# Patient Record
Sex: Female | Born: 1980 | Race: Black or African American | Hispanic: No | Marital: Single | Smoking: Former smoker
Health system: Southern US, Community
[De-identification: ages and names within clinical notes are randomized; demographics above are authoritative.]

## PROBLEM LIST (undated history)

## (undated) ENCOUNTER — Inpatient Hospital Stay (HOSPITAL_COMMUNITY): Payer: Self-pay

## (undated) DIAGNOSIS — Q513 Bicornate uterus: Secondary | ICD-10-CM

## (undated) DIAGNOSIS — K219 Gastro-esophageal reflux disease without esophagitis: Secondary | ICD-10-CM

## (undated) DIAGNOSIS — E785 Hyperlipidemia, unspecified: Secondary | ICD-10-CM

## (undated) DIAGNOSIS — N898 Other specified noninflammatory disorders of vagina: Principal | ICD-10-CM

## (undated) DIAGNOSIS — D649 Anemia, unspecified: Secondary | ICD-10-CM

## (undated) DIAGNOSIS — B379 Candidiasis, unspecified: Secondary | ICD-10-CM

## (undated) DIAGNOSIS — O26891 Other specified pregnancy related conditions, first trimester: Principal | ICD-10-CM

## (undated) DIAGNOSIS — E669 Obesity, unspecified: Secondary | ICD-10-CM

## (undated) DIAGNOSIS — Q5128 Other doubling of uterus, other specified: Secondary | ICD-10-CM

## (undated) DIAGNOSIS — O26899 Other specified pregnancy related conditions, unspecified trimester: Secondary | ICD-10-CM

## (undated) DIAGNOSIS — F419 Anxiety disorder, unspecified: Secondary | ICD-10-CM

## (undated) DIAGNOSIS — Q512 Other doubling of uterus, unspecified: Secondary | ICD-10-CM

## (undated) DIAGNOSIS — R12 Heartburn: Secondary | ICD-10-CM

## (undated) HISTORY — DX: Other and unspecified doubling of uterus: Q51.28

## (undated) HISTORY — DX: Other doubling of uterus, unspecified: Q51.20

## (undated) HISTORY — DX: Other specified noninflammatory disorders of vagina: N89.8

## (undated) HISTORY — DX: Candidiasis, unspecified: B37.9

## (undated) HISTORY — PX: WISDOM TOOTH EXTRACTION: SHX21

## (undated) HISTORY — DX: Bicornate uterus: Q51.3

## (undated) HISTORY — DX: Anemia, unspecified: D64.9

## (undated) HISTORY — PX: OTHER SURGICAL HISTORY: SHX169

## (undated) HISTORY — DX: Other specified pregnancy related conditions, first trimester: O26.891

## (undated) HISTORY — DX: Hyperlipidemia, unspecified: E78.5

## (undated) HISTORY — DX: Obesity, unspecified: E66.9

---
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
## 2001-01-13 ENCOUNTER — Ambulatory Visit (HOSPITAL_COMMUNITY): Admission: RE | Admit: 2001-01-13 | Discharge: 2001-01-13 | Payer: Self-pay | Admitting: Internal Medicine

## 2001-01-13 ENCOUNTER — Encounter: Payer: Self-pay | Admitting: Internal Medicine

## 2003-04-25 ENCOUNTER — Emergency Department (HOSPITAL_COMMUNITY): Admission: EM | Admit: 2003-04-25 | Discharge: 2003-04-25 | Payer: Self-pay | Admitting: Emergency Medicine

## 2003-04-25 ENCOUNTER — Encounter: Payer: Self-pay | Admitting: Emergency Medicine

## 2007-05-03 ENCOUNTER — Emergency Department (HOSPITAL_COMMUNITY): Admission: EM | Admit: 2007-05-03 | Discharge: 2007-05-03 | Payer: Self-pay | Admitting: Emergency Medicine

## 2008-07-23 ENCOUNTER — Inpatient Hospital Stay (HOSPITAL_COMMUNITY): Admission: AD | Admit: 2008-07-23 | Discharge: 2008-07-23 | Payer: Self-pay | Admitting: Obstetrics & Gynecology

## 2008-07-30 ENCOUNTER — Ambulatory Visit (HOSPITAL_COMMUNITY): Admission: RE | Admit: 2008-07-30 | Discharge: 2008-07-30 | Payer: Self-pay | Admitting: Obstetrics and Gynecology

## 2009-07-25 ENCOUNTER — Inpatient Hospital Stay (HOSPITAL_COMMUNITY): Admission: RE | Admit: 2009-07-25 | Discharge: 2009-07-27 | Payer: Self-pay | Admitting: Obstetrics and Gynecology

## 2009-11-15 ENCOUNTER — Other Ambulatory Visit: Admission: RE | Admit: 2009-11-15 | Discharge: 2009-11-15 | Payer: Self-pay | Admitting: Obstetrics and Gynecology

## 2010-10-27 LAB — RH IMMUNE GLOB WKUP(>/=20WKS)(NOT WOMEN'S HOSP): Fetal Screen: NEGATIVE

## 2010-10-27 LAB — CBC
HCT: 34.8 % — ABNORMAL LOW (ref 36.0–46.0)
Hemoglobin: 11.7 g/dL — ABNORMAL LOW (ref 12.0–15.0)
MCHC: 33.2 g/dL (ref 30.0–36.0)
MCHC: 33.5 g/dL (ref 30.0–36.0)
MCV: 82.6 fL (ref 78.0–100.0)
MCV: 83.5 fL (ref 78.0–100.0)
Platelets: 200 10*3/uL (ref 150–400)
Platelets: 240 10*3/uL (ref 150–400)
RBC: 4.21 MIL/uL (ref 3.87–5.11)
RDW: 14.6 % (ref 11.5–15.5)
RDW: 14.6 % (ref 11.5–15.5)
WBC: 11.1 10*3/uL — ABNORMAL HIGH (ref 4.0–10.5)

## 2010-10-27 LAB — RPR: RPR Ser Ql: NONREACTIVE

## 2010-11-10 LAB — ABO/RH: ABO/RH(D): A NEG

## 2010-11-10 LAB — CBC
MCV: 81.6 fL (ref 78.0–100.0)
Platelets: 291 10*3/uL (ref 150–400)
WBC: 8.6 10*3/uL (ref 4.0–10.5)

## 2010-11-10 LAB — HCG, QUANTITATIVE, PREGNANCY: hCG, Beta Chain, Quant, S: 17 m[IU]/mL — ABNORMAL HIGH (ref <5)

## 2011-05-01 LAB — URINALYSIS, ROUTINE W REFLEX MICROSCOPIC
Bilirubin Urine: NEGATIVE
Nitrite: NEGATIVE
Specific Gravity, Urine: 1.025 (ref 1.005–1.030)
Urobilinogen, UA: 0.2 mg/dL (ref 0.0–1.0)

## 2011-05-01 LAB — RH IMMUNE GLOBULIN WORKUP (NOT WOMEN'S HOSP): Antibody Screen: NEGATIVE

## 2011-05-01 LAB — CBC
MCHC: 33.3 g/dL (ref 30.0–36.0)
RBC: 4.39 MIL/uL (ref 3.87–5.11)
WBC: 6.3 10*3/uL (ref 4.0–10.5)

## 2011-05-01 LAB — URINE MICROSCOPIC-ADD ON

## 2011-05-01 LAB — HCG, QUANTITATIVE, PREGNANCY: hCG, Beta Chain, Quant, S: 1710 m[IU]/mL — ABNORMAL HIGH (ref <5)

## 2011-05-01 LAB — ABO/RH: ABO/RH(D): A NEG

## 2011-10-20 ENCOUNTER — Encounter: Payer: Self-pay | Admitting: Family Medicine

## 2011-10-20 ENCOUNTER — Ambulatory Visit (INDEPENDENT_AMBULATORY_CARE_PROVIDER_SITE_OTHER): Payer: BC Managed Care – PPO | Admitting: Family Medicine

## 2011-10-20 VITALS — BP 130/74 | HR 99 | Resp 18 | Ht 64.25 in | Wt 274.1 lb

## 2011-10-20 DIAGNOSIS — F17200 Nicotine dependence, unspecified, uncomplicated: Secondary | ICD-10-CM

## 2011-10-20 DIAGNOSIS — D649 Anemia, unspecified: Secondary | ICD-10-CM

## 2011-10-20 DIAGNOSIS — Z720 Tobacco use: Secondary | ICD-10-CM

## 2011-10-20 DIAGNOSIS — E785 Hyperlipidemia, unspecified: Secondary | ICD-10-CM

## 2011-10-20 DIAGNOSIS — E669 Obesity, unspecified: Secondary | ICD-10-CM

## 2011-10-20 NOTE — Assessment & Plan Note (Signed)
Check CBC 

## 2011-10-20 NOTE — Assessment & Plan Note (Signed)
·   Obtain FLP

## 2011-10-20 NOTE — Assessment & Plan Note (Addendum)
Discussed importance of weight loss and exercise. Patient heaviest weight 290 pounds. She was on phentermine in the past. I think about any other comorbidities and her history of to well on this medication before this can be restarted if her labs come back normal.

## 2011-10-20 NOTE — Patient Instructions (Signed)
Get your blood work done fasting- do not eat midnight If your labs look good we will start phentermine at 1/2 tablet and then increase to 1 full tablet Schedule a physical for your PAP Smear and immunizations

## 2011-10-20 NOTE — Progress Notes (Signed)
  Subjective:    Patient ID: Amanda Sanchez, female    DOB: 05/12/1981, 31 y.o.   MRN: [id number]  HPI Patient here to establish care. Her previous primary care provider. She's not been seen in greater than 2 years. Medications and history reviewed Her last Pap smear was 2 years ago by [facility]. She also had Implanon placed at that time. She does not have a menstrual cycle  Obesity-  Patients heavist weight was 290 pounds. She was seen by the weight loss clinic in [place] however cannot afford to get to a couple visits. She was also seen bya  clinic in [place] a few years ago and was on phentermine where she lost 30 pounds in the past. At that time she was exercising 5 days a week. She will like to restart this medication.  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 [place] She is married with a 9 year old son  Review of Systems   GEN- denies fatigue, fever, weight loss,weakness, recent illness HEENT- denies eye drainage, change in vision, nasal discharge, CVS- denies chest pain, palpitations RESP- denies SOB, cough, wheeze ABD- denies N/V, change in stools, abd pain GU- denies dysuria, hematuria, dribbling, incontinence MSK- denies joint pain, muscle aches, injury Neuro- denies headache, dizziness, syncope, seizure activity      Objective:   Physical Exam  GEN- NAD, alert and oriented x3, obese HEENT- PERRL, EOMI, non injected sclera, pink conjunctiva, MMM, oropharynx clear Neck- Supple, no thyromegaly CVS- RRR, no murmur RESP-CTAB ABD- NABS,soft, NT, ND EXT- No edema Pulses- Radial, DP- 2+ Psych-normal mentation, not depressed or anxious appearing      Assessment & Plan:

## 2011-10-20 NOTE — Assessment & Plan Note (Signed)
Discussed importance of cessation  

## 2011-10-21 LAB — CBC
HCT: 41.5 % (ref 36.0–46.0)
MCH: 27.1 pg (ref 26.0–34.0)
MCHC: 32.5 g/dL (ref 30.0–36.0)
MCV: 83.3 fL (ref 78.0–100.0)
RDW: 14.2 % (ref 11.5–15.5)

## 2011-10-21 LAB — COMPREHENSIVE METABOLIC PANEL
ALT: <8 U/L (ref 0–35)
AST: 14 U/L (ref 0–37)
Chloride: 107 mEq/L (ref 96–112)
Creat: 0.84 mg/dL (ref 0.50–1.10)
Total Bilirubin: 0.8 mg/dL (ref 0.3–1.2)

## 2011-10-21 LAB — TSH: TSH: 1.964 u[IU]/mL (ref 0.350–4.500)

## 2011-10-21 LAB — LIPID PANEL: LDL Cholesterol: 126 mg/dL — ABNORMAL HIGH (ref 0–99)

## 2011-10-27 ENCOUNTER — Encounter: Payer: Self-pay | Admitting: Family Medicine

## 2011-10-27 ENCOUNTER — Other Ambulatory Visit (HOSPITAL_COMMUNITY)
Admission: RE | Admit: 2011-10-27 | Discharge: 2011-10-27 | Disposition: A | Discharge status: Home / Self Care | Payer: BC Managed Care – PPO | Source: Ambulatory Visit | Attending: Family Medicine | Admitting: Family Medicine

## 2011-10-27 ENCOUNTER — Ambulatory Visit (INDEPENDENT_AMBULATORY_CARE_PROVIDER_SITE_OTHER): Payer: BC Managed Care – PPO | Admitting: Family Medicine

## 2011-10-27 VITALS — BP 110/70 | HR 103 | Resp 15 | Ht 64.25 in | Wt 275.4 lb

## 2011-10-27 DIAGNOSIS — Z124 Encounter for screening for malignant neoplasm of cervix: Secondary | ICD-10-CM

## 2011-10-27 DIAGNOSIS — Z01419 Encounter for gynecological examination (general) (routine) without abnormal findings: Secondary | ICD-10-CM | POA: Insufficient documentation

## 2011-10-27 DIAGNOSIS — N923 Ovulation bleeding: Secondary | ICD-10-CM

## 2011-10-27 DIAGNOSIS — N760 Acute vaginitis: Secondary | ICD-10-CM

## 2011-10-27 DIAGNOSIS — B372 Candidiasis of skin and nail: Secondary | ICD-10-CM | POA: Insufficient documentation

## 2011-10-27 DIAGNOSIS — D649 Anemia, unspecified: Secondary | ICD-10-CM

## 2011-10-27 DIAGNOSIS — N921 Excessive and frequent menstruation with irregular cycle: Secondary | ICD-10-CM

## 2011-10-27 DIAGNOSIS — N920 Excessive and frequent menstruation with regular cycle: Secondary | ICD-10-CM

## 2011-10-27 DIAGNOSIS — N898 Other specified noninflammatory disorders of vagina: Secondary | ICD-10-CM

## 2011-10-27 DIAGNOSIS — E785 Hyperlipidemia, unspecified: Secondary | ICD-10-CM

## 2011-10-27 MED ORDER — PHENTERMINE HCL 37.5 MG PO TABS: 37.5000 mg | ORAL_TABLET | Freq: Every day | ORAL | Status: DC

## 2011-10-27 MED ORDER — NYSTATIN 100000 UNIT/GM EX CREA: TOPICAL_CREAM | Freq: Two times a day (BID) | CUTANEOUS | Status: AC | PRN

## 2011-10-27 NOTE — Assessment & Plan Note (Signed)
Will start phentermine

## 2011-10-27 NOTE — Progress Notes (Signed)
  Subjective:    Patient ID: Amanda Sanchez, female    DOB: 12-28-1980, 31 y.o.   MRN: [id number]  HPI Pt here for GYN exam, seen last week for New pt Appt Concerns- itching along right groin, on and off has rash that pops up, history of boils  Contraception- patient Implanon placed 2 years ago. She's had random spotting with her Implanon since it was placed. She knows she has increased bottom when she does work out. Occasionally she does have an actual period.  Today she has some spotting.  Request STD check with PAP Smear   Review of Systems  GEN- denies fatigue, fever, weight loss,weakness, recent illness HEENT- denies eye drainage, change in vision, nasal discharge, CVS- denies chest pain, palpitations RESP- denies SOB, cough, wheeze ABD- denies N/V, change in stools, abd pain GU- denies dysuria, hematuria, dribbling, incontinence, vaginal discharge MSK- denies joint pain, muscle aches, injury Neuro- denies headache, dizziness, syncope, seizure activity      Objective:   Physical Exam GEN- NAD, alert and oriented x 3, obese Breast- normal symmetry, no nipple inversion,no nipple drainage, no nodules or lumps felt Nodes- no axillary nodes GU- normal external genitalia, vaginal mucosa pink and moist, cervix visualized no growth, + dark brown blood form os, no discharge, no CMT, no ovarian masses, uterus normal size Skin- groin no rash, moist region Ext- Implanon felt on along inner left upper arm      Assessment & Plan:    GYN Exam- PAP Smear performed, cultures sent for STD check,  Labs reviewed with pt. Immunizations UTD

## 2011-10-27 NOTE — Progress Notes (Signed)
Addended by: Abner Greenspan on: 10/27/2011 09:59 AM   Modules accepted: Orders

## 2011-10-27 NOTE — Assessment & Plan Note (Signed)
Secondary to implanon, pt to decide if she wants this taken out, will need to go back to GYN to have this done

## 2011-10-27 NOTE — Assessment & Plan Note (Signed)
Mildly elevated LDL, encourage change in diet and weight loss

## 2011-10-27 NOTE — Assessment & Plan Note (Signed)
I think she is getting yeast infections in the folds, no overwhelming infection seen today. Will give nystatin cream, she can try powder as well

## 2011-10-27 NOTE — Assessment & Plan Note (Signed)
History of anemia, hemoglobin now normal, with normal MCV

## 2011-10-27 NOTE — Patient Instructions (Signed)
Start the phentermine with 1/2 tablet then increase to 1 tablet See your GYN if you want the implanon taken out I recommend 30 minutes of exercise 5 days a week Dental visit every 6 months Eye appt every other year Make sure to avoid fried foods, fatty foods, eat fresh fruits and veggies Multivitamin recommended F/U 2 months for weight

## 2011-10-28 LAB — WET PREP BY MOLECULAR PROBE
Gardnerella vaginalis: NEGATIVE
Trichomonas vaginosis: NEGATIVE

## 2011-12-29 ENCOUNTER — Ambulatory Visit: Payer: BC Managed Care – PPO | Admitting: Family Medicine

## 2014-05-02 ENCOUNTER — Encounter: Payer: Self-pay | Admitting: Adult Health

## 2014-05-02 ENCOUNTER — Ambulatory Visit (INDEPENDENT_AMBULATORY_CARE_PROVIDER_SITE_OTHER): Payer: Medicaid Other | Admitting: Adult Health

## 2014-05-02 VITALS — BP 122/62 | Ht 64.0 in | Wt 296.0 lb

## 2014-05-02 DIAGNOSIS — N898 Other specified noninflammatory disorders of vagina: Secondary | ICD-10-CM

## 2014-05-02 DIAGNOSIS — O26891 Other specified pregnancy related conditions, first trimester: Principal | ICD-10-CM

## 2014-05-02 DIAGNOSIS — Z331 Pregnant state, incidental: Secondary | ICD-10-CM

## 2014-05-02 DIAGNOSIS — B379 Candidiasis, unspecified: Secondary | ICD-10-CM

## 2014-05-02 HISTORY — DX: Other specified noninflammatory disorders of vagina: N89.8

## 2014-05-02 HISTORY — DX: Candidiasis, unspecified: B37.9

## 2014-05-02 LAB — POCT WET PREP (WET MOUNT): WBC WET PREP: POSITIVE

## 2014-05-02 MED ORDER — FLUCONAZOLE 150 MG PO TABS: 150.0000 mg | ORAL_TABLET | Freq: Once | ORAL | Status: DC

## 2014-05-02 NOTE — Patient Instructions (Addendum)
Monilial Vaginitis Vaginitis in a soreness, swelling and redness (inflammation) of the vagina and vulva. Monilial vaginitis is not a sexually transmitted infection. CAUSES  Yeast vaginitis is caused by yeast (candida) that is normally found in your vagina. With a yeast infection, the candida has overgrown in number to a point that upsets the chemical balance. SYMPTOMS   White, thick vaginal discharge.  Swelling, itching, redness and irritation of the vagina and possibly the lips of the vagina (vulva).  Burning or painful urination.  Painful intercourse. DIAGNOSIS  Things that may contribute to monilial vaginitis are:  Postmenopausal and virginal states.  Pregnancy.  Infections.  Being tired, sick or stressed, especially if you had monilial vaginitis in the past.  Diabetes. Good control will help lower the chance.  Birth control pills.  Tight fitting garments.  Using bubble bath, feminine sprays, douches or deodorant tampons.  Taking certain medications that kill germs (antibiotics).  Sporadic recurrence can occur if you become ill. TREATMENT  Your caregiver will give you medication.  There are several kinds of anti monilial vaginal creams and suppositories specific for monilial vaginitis. For recurrent yeast infections, use a suppository or cream in the vagina 2 times a week, or as directed.  Anti-monilial or steroid cream for the itching or irritation of the vulva may also be used. Get your caregiver's permission.  Painting the vagina with methylene blue solution may help if the monilial cream does not work.  Eating yogurt may help prevent monilial vaginitis. HOME CARE INSTRUCTIONS   Finish all medication as prescribed.  Do not have sex until treatment is completed or after your caregiver tells you it is okay.  Take warm sitz baths.  Do not douche.  Do not use tampons, especially scented ones.  Wear cotton underwear.  Avoid tight pants and panty  hose.  Tell your sexual partner that you have a yeast infection. They should go to their caregiver if they have symptoms such as mild rash or itching.  Your sexual partner should be treated as well if your infection is difficult to eliminate.  Practice safer sex. Use condoms.  Some vaginal medications cause latex condoms to fail. Vaginal medications that harm condoms are:  Cleocin cream.  Butoconazole (Femstat).  Terconazole (Terazol) vaginal suppository.  Miconazole (Monistat) (may be purchased over the counter). SEEK MEDICAL CARE IF:   You have a temperature by mouth above 102 F (38.9 C).  The infection is getting worse after 2 days of treatment.  The infection is not getting better after 3 days of treatment.  You develop blisters in or around your vagina.  You develop vaginal bleeding, and it is not your menstrual period.  You have pain when you urinate.  You develop intestinal problems.  You have pain with sexual intercourse. Document Released: 04/22/2005 Document Revised: 10/05/2011 Document Reviewed: 01/04/2009 [facility] Patient Information 2015 [place], [place]. This information is not intended to replace advice given to you by your health care provider. Make sure you discuss any questions you have with your health care provider. take 1 diflucan First Trimester of Pregnancy The first trimester of pregnancy is from week 1 until the end of week 12 (months 1 through 3). A week after a sperm fertilizes an egg, the egg will implant on the wall of the uterus. This embryo will begin to develop into a baby. Genes from you and your partner are forming the baby. The female genes determine whether the baby is a boy or a girl. At 6-8 weeks,  the eyes and face are formed, and the heartbeat can be seen on ultrasound. At the end of 12 weeks, all the baby's organs are formed.  Now that you are pregnant, you will want to do everything you can to have a healthy baby. Two of the most  important things are to get good prenatal care and to follow your health care provider's instructions. Prenatal care is all the medical care you receive before the baby's birth. This care will help prevent, find, and treat any problems during the pregnancy and childbirth. BODY CHANGES Your body goes through many changes during pregnancy. The changes vary from woman to woman.   You may gain or lose a couple of pounds at first.  You may feel sick to your stomach (nauseous) and throw up (vomit). If the vomiting is uncontrollable, call your health care provider.  You may tire easily.  You may develop headaches that can be relieved by medicines approved by your health care provider.  You may urinate more often. Painful urination may mean you have a bladder infection.  You may develop heartburn as a result of your pregnancy.  You may develop constipation because certain hormones are causing the muscles that push waste through your intestines to slow down.  You may develop hemorrhoids or swollen, bulging veins (varicose veins).  Your breasts may begin to grow larger and become tender. Your nipples may stick out more, and the tissue that surrounds them (areola) may become darker.  Your gums may bleed and may be sensitive to brushing and flossing.  Dark spots or blotches (chloasma, mask of pregnancy) may develop on your face. This will likely fade after the baby is born.  Your menstrual periods will stop.  You may have a loss of appetite.  You may develop cravings for certain kinds of food.  You may have changes in your emotions from day to day, such as being excited to be pregnant or being concerned that something may go wrong with the pregnancy and baby.  You may have more vivid and strange dreams.  You may have changes in your hair. These can include thickening of your hair, rapid growth, and changes in texture. Some women also have hair loss during or after pregnancy, or hair that  feels dry or thin. Your hair will most likely return to normal after your baby is born. WHAT TO EXPECT AT YOUR PRENATAL VISITS During a routine prenatal visit:  You will be weighed to make sure you and the baby are growing normally.  Your blood pressure will be taken.  Your abdomen will be measured to track your baby's growth.  The fetal heartbeat will be listened to starting around week 10 or 12 of your pregnancy.  Test results from any previous visits will be discussed. Your health care provider may ask you:  How you are feeling.  If you are feeling the baby move.  If you have had any abnormal symptoms, such as leaking fluid, bleeding, severe headaches, or abdominal cramping.  If you have any questions. Other tests that may be performed during your first trimester include:  Blood tests to find your blood type and to check for the presence of any previous infections. They will also be used to check for low iron levels (anemia) and Rh antibodies. Later in the pregnancy, blood tests for diabetes will be done along with other tests if problems develop.  Urine tests to check for infections, diabetes, or protein in the urine.  An  ultrasound to confirm the proper growth and development of the baby.  An amniocentesis to check for possible genetic problems.  Fetal screens for spina bifida and Down syndrome.  You may need other tests to make sure you and the baby are doing well. HOME CARE INSTRUCTIONS  Medicines  Follow your health care provider's instructions regarding medicine use. Specific medicines may be either safe or unsafe to take during pregnancy.  Take your prenatal vitamins as directed.  If you develop constipation, try taking a stool softener if your health care provider approves. Diet  Eat regular, well-balanced meals. Choose a variety of foods, such as meat or vegetable-based protein, fish, milk and low-fat dairy products, vegetables, fruits, and whole grain breads  and cereals. Your health care provider will help you determine the amount of weight gain that is right for you.  Avoid raw meat and uncooked cheese. These carry germs that can cause birth defects in the baby.  Eating four or five small meals rather than three large meals a day may help relieve nausea and vomiting. If you start to feel nauseous, eating a few soda crackers can be helpful. Drinking liquids between meals instead of during meals also seems to help nausea and vomiting.  If you develop constipation, eat more high-fiber foods, such as fresh vegetables or fruit and whole grains. Drink enough fluids to keep your urine clear or pale yellow. Activity and Exercise  Exercise only as directed by your health care provider. Exercising will help you:  Control your weight.  Stay in shape.  Be prepared for labor and delivery.  Experiencing pain or cramping in the lower abdomen or low back is a good sign that you should stop exercising. Check with your health care provider before continuing normal exercises.  Try to avoid standing for long periods of time. Move your legs often if you must stand in one place for a long time.  Avoid heavy lifting.  Wear low-heeled shoes, and practice good posture.  You may continue to have sex unless your health care provider directs you otherwise. Relief of Pain or Discomfort  Wear a good support bra for breast tenderness.   Take warm sitz baths to soothe any pain or discomfort caused by hemorrhoids. Use hemorrhoid cream if your health care provider approves.   Rest with your legs elevated if you have leg cramps or low back pain.  If you develop varicose veins in your legs, wear support hose. Elevate your feet for 15 minutes, 3-4 times a day. Limit salt in your diet. Prenatal Care  Schedule your prenatal visits by the twelfth week of pregnancy. They are usually scheduled monthly at first, then more often in the last 2 months before  delivery.  Write down your questions. Take them to your prenatal visits.  Keep all your prenatal visits as directed by your health care provider. Safety  Wear your seat belt at all times when driving.  Make a list of emergency phone numbers, including numbers for family, friends, the hospital, and police and fire departments. General Tips  Ask your health care provider for a referral to a local prenatal education class. Begin classes no later than at the beginning of month 6 of your pregnancy.  Ask for help if you have counseling or nutritional needs during pregnancy. Your health care provider can offer advice or refer you to specialists for help with various needs.  Do not use hot tubs, steam rooms, or saunas.  Do not douche or use  tampons or scented sanitary pads.  Do not cross your legs for long periods of time.  Avoid cat litter boxes and soil used by cats. These carry germs that can cause birth defects in the baby and possibly loss of the fetus by miscarriage or stillbirth.  Avoid all smoking, herbs, alcohol, and medicines not prescribed by your health care provider. Chemicals in these affect the formation and growth of the baby.  Schedule a dentist appointment. At home, brush your teeth with a soft toothbrush and be gentle when you floss. SEEK MEDICAL CARE IF:   You have dizziness.  You have mild pelvic cramps, pelvic pressure, or nagging pain in the abdominal area.  You have persistent nausea, vomiting, or diarrhea.  You have a bad smelling vaginal discharge.  You have pain with urination.  You notice increased swelling in your face, hands, legs, or ankles. SEEK IMMEDIATE MEDICAL CARE IF:   You have a fever.  You are leaking fluid from your vagina.  You have spotting or bleeding from your vagina.  You have severe abdominal cramping or pain.  You have rapid weight gain or loss.  You vomit blood or material that looks like coffee grounds.  You are exposed to  [place] measles and have never had them.  You are exposed to fifth disease or chickenpox.  You develop a severe headache.  You have shortness of breath.  You have any kind of trauma, such as from a fall or a car accident. Document Released: 07/07/2001 Document Revised: 11/27/2013 Document Reviewed: 05/23/2013 [facility] Patient Information 2015 [place], [place]. This information is not intended to replace advice given to you by your health care provider. Make sure you discuss any questions you have with your health care provider.

## 2014-05-02 NOTE — Progress Notes (Signed)
Subjective:     Patient ID: Amanda ParisNatasha L Sanchez, female   DOB: 11/10/1980, 33 y.o.   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  Review of Systems See HPI Reviewed past medical,surgical, social and family history. Reviewed medications and allergies.     Objective:   Physical Exam BP 122/62  Ht 5\' 4"  (1.626 m)  Wt 296 lb (134.265 kg)  BMI 50.78 kg/m2  LMP 03/08/2014   Skin warm and dry.Pelvic: external genitalia is normal in appearance, vagina: white discharge without odor, cervix:smooth, may have second cervix, uterus: normal size, shape and contour, non tender, no masses felt, adnexa: no masses or tenderness noted,difficuklt exam secondary to abdominal girth. Wet prep: + yeast buds and +WBCs. GC/CHL obtained.  She is about 8 weeks by LMP with EDD 12/14/14.  Assessment:     Vaginal discharge in first trimester of pregnancy Yeast infection    Plan:     Rx diflucan 150 mg #1 take 1 now with 1 refill GC/CHL Return as scheduled for US and new OB Review handout on yeast and first tirmester   Request prior OB records

## 2014-05-03 ENCOUNTER — Telehealth: Payer: Self-pay | Admitting: Adult Health

## 2014-05-03 ENCOUNTER — Telehealth: Payer: Self-pay | Admitting: *Deleted

## 2014-05-03 LAB — GC/CHLAMYDIA PROBE AMP
CT PROBE, AMP APTIMA: NEGATIVE
GC Probe RNA: NEGATIVE

## 2014-05-03 NOTE — Telephone Encounter (Signed)
Left message x 1. JSY 

## 2014-05-03 NOTE — Telephone Encounter (Signed)
Spoke with pt. Pt had an internal exam yesterday checking for a yeast. Pt started spotting yesterday and into today. Spotting has stopped now. I advised spotting can be normal after an internal exam. Advised to push fluids, and take it easy. Pt has an appt scheduled for 05/09/14. Advised to keep that appt and call sooner with any problems. Pt voiced understanding. JSY

## 2014-05-03 NOTE — Telephone Encounter (Signed)
Spoke with pt. Pt had an internal exam yesterday checking for yeast. Pt started spotting yesterday into today. Spotting has stopped. I advised spotting can be normal after an internal exam. Advised to increase fluids, and take it easy. Pt has appt on 05/09/14. Advised to call with any problems. Pt voiced understanding. JSY

## 2014-05-08 ENCOUNTER — Other Ambulatory Visit (INDEPENDENT_AMBULATORY_CARE_PROVIDER_SITE_OTHER): Payer: Medicaid Other

## 2014-05-08 ENCOUNTER — Encounter: Payer: Self-pay | Admitting: Adult Health

## 2014-05-08 ENCOUNTER — Other Ambulatory Visit: Payer: Self-pay | Admitting: Adult Health

## 2014-05-08 DIAGNOSIS — O3680X0 Pregnancy with inconclusive fetal viability, not applicable or unspecified: Secondary | ICD-10-CM

## 2014-05-08 DIAGNOSIS — Q512 Other doubling of uterus: Secondary | ICD-10-CM

## 2014-05-08 DIAGNOSIS — O3401 Maternal care for unspecified congenital malformation of uterus, first trimester: Secondary | ICD-10-CM

## 2014-05-08 DIAGNOSIS — Q5128 Other doubling of uterus, other specified: Secondary | ICD-10-CM

## 2014-05-08 DIAGNOSIS — O26841 Uterine size-date discrepancy, first trimester: Secondary | ICD-10-CM

## 2014-05-08 DIAGNOSIS — Q513 Bicornate uterus: Secondary | ICD-10-CM

## 2014-05-08 DIAGNOSIS — Z98891 History of uterine scar from previous surgery: Secondary | ICD-10-CM

## 2014-05-08 DIAGNOSIS — O34591 Maternal care for other abnormalities of gravid uterus, first trimester: Secondary | ICD-10-CM

## 2014-05-08 NOTE — Progress Notes (Signed)
U/S-Didelphys uterus-single IUP within Rt uterus with +FCA noted, FHR-148 bpm, CRL c/w 6+3wks, EDD 12/28/2014, Rt cx appears closed with prev. C.s site noted, Lt uterus appears anteverted WNL, bilateral adnexa appears WNL

## 2014-05-09 ENCOUNTER — Other Ambulatory Visit: Payer: Self-pay

## 2014-05-15 ENCOUNTER — Encounter: Payer: Medicaid Other | Admitting: Women's Health

## 2014-05-16 ENCOUNTER — Ambulatory Visit (INDEPENDENT_AMBULATORY_CARE_PROVIDER_SITE_OTHER): Payer: Medicaid Other | Admitting: Advanced Practice Midwife

## 2014-05-16 ENCOUNTER — Other Ambulatory Visit (HOSPITAL_COMMUNITY)
Admission: RE | Admit: 2014-05-16 | Discharge: 2014-05-16 | Disposition: A | Discharge status: Home / Self Care | Payer: Medicaid Other | Source: Ambulatory Visit | Attending: Advanced Practice Midwife | Admitting: Advanced Practice Midwife

## 2014-05-16 ENCOUNTER — Encounter: Payer: Self-pay | Admitting: Advanced Practice Midwife

## 2014-05-16 VITALS — BP 112/72 | Wt 298.0 lb

## 2014-05-16 DIAGNOSIS — Q5128 Other doubling of uterus, other specified: Secondary | ICD-10-CM

## 2014-05-16 DIAGNOSIS — Z1159 Encounter for screening for other viral diseases: Secondary | ICD-10-CM

## 2014-05-16 DIAGNOSIS — Z124 Encounter for screening for malignant neoplasm of cervix: Secondary | ICD-10-CM

## 2014-05-16 DIAGNOSIS — Z01419 Encounter for gynecological examination (general) (routine) without abnormal findings: Secondary | ICD-10-CM | POA: Insufficient documentation

## 2014-05-16 DIAGNOSIS — Z130 Encounter for screening for diseases of the blood and blood-forming organs and certain disorders involving the immune mechanism: Secondary | ICD-10-CM

## 2014-05-16 DIAGNOSIS — Z3490 Encounter for supervision of normal pregnancy, unspecified, unspecified trimester: Secondary | ICD-10-CM | POA: Insufficient documentation

## 2014-05-16 DIAGNOSIS — Z118 Encounter for screening for other infectious and parasitic diseases: Secondary | ICD-10-CM

## 2014-05-16 DIAGNOSIS — Z1151 Encounter for screening for human papillomavirus (HPV): Secondary | ICD-10-CM | POA: Insufficient documentation

## 2014-05-16 DIAGNOSIS — Z1389 Encounter for screening for other disorder: Secondary | ICD-10-CM

## 2014-05-16 DIAGNOSIS — Z0184 Encounter for antibody response examination: Secondary | ICD-10-CM

## 2014-05-16 DIAGNOSIS — Z113 Encounter for screening for infections with a predominantly sexual mode of transmission: Secondary | ICD-10-CM

## 2014-05-16 DIAGNOSIS — Z3481 Encounter for supervision of other normal pregnancy, first trimester: Secondary | ICD-10-CM

## 2014-05-16 DIAGNOSIS — O34219 Maternal care for unspecified type scar from previous cesarean delivery: Secondary | ICD-10-CM

## 2014-05-16 DIAGNOSIS — Z331 Pregnant state, incidental: Secondary | ICD-10-CM

## 2014-05-16 DIAGNOSIS — Z1371 Encounter for nonprocreative screening for genetic disease carrier status: Secondary | ICD-10-CM

## 2014-05-16 DIAGNOSIS — Q512 Other doubling of uterus, unspecified: Secondary | ICD-10-CM

## 2014-05-16 DIAGNOSIS — Z0283 Encounter for blood-alcohol and blood-drug test: Secondary | ICD-10-CM

## 2014-05-16 DIAGNOSIS — Z114 Encounter for screening for human immunodeficiency virus [HIV]: Secondary | ICD-10-CM

## 2014-05-16 LAB — OB RESULTS CONSOLE ANTIBODY SCREEN: Antibody Screen: NEGATIVE

## 2014-05-16 LAB — CBC
HCT: 35.7 % — ABNORMAL LOW (ref 36.0–46.0)
HEMOGLOBIN: 11.7 g/dL — AB (ref 12.0–15.0)
MCH: 26.8 pg (ref 26.0–34.0)
MCHC: 32.8 g/dL (ref 30.0–36.0)
MCV: 81.7 fL (ref 78.0–100.0)
PLATELETS: 304 10*3/uL (ref 150–400)
RBC: 4.37 MIL/uL (ref 3.87–5.11)
RDW: 14.3 % (ref 11.5–15.5)
WBC: 9.5 10*3/uL (ref 4.0–10.5)

## 2014-05-16 LAB — POCT URINALYSIS DIPSTICK
Glucose, UA: NEGATIVE
Ketones, UA: NEGATIVE
LEUKOCYTES UA: NEGATIVE
NITRITE UA: NEGATIVE
PROTEIN UA: NEGATIVE
RBC UA: NEGATIVE

## 2014-05-16 NOTE — Progress Notes (Signed)
Subjective:    Amanda Sanchez is a G3P1011 1128w5d being seen today for her first obstetrical visit.  Her obstetrical history is significant for morbid obesity. Pregnancy history fully reviewed. She had an elective c/s for didelphys uterus.  Plans repeat  Patient reports no problems. Feels well.  Filed Vitals:   05/16/14 1537  BP: 112/72  Weight: 298 lb (135.172 kg)    HISTORY: OB History  Gravida Para Term Preterm AB SAB TAB Ectopic Multiple Living  3 1 1  1 1    1     # Outcome Date GA Lbr Len/2nd Weight Sex Delivery Anes PTL Lv  3 CUR           2 TRM 07/15/09 6934w0d  6 lb 9 oz (2.977 kg) M CS Gen N Y     Comments: elective d/t uterus didelphys  1 SAB 06/26/08             Past Medical History  Diagnosis Date  . Hyperlipidemia   . Anemia   . Obesity   . Vaginal discharge during pregnancy in first trimester 05/02/2014  . Yeast infection 05/02/2014  . Complete bicornuate uterus     2 cervices  . Uterus didelphys 2 cervices   Past Surgical History  Procedure Laterality Date  . Cesarean section  2010   Family History  Problem Relation Age of Onset  . Diabetes Maternal Grandfather   . Hypertension Maternal Grandfather   . Diabetes Paternal Grandmother   . Hypertension Paternal Grandmother   . Diabetes Paternal Grandfather   . Hypertension Paternal Grandfather   . Fibroids Mother   . Hyperlipidemia Father      Exam       Pelvic Exam:    Perineum: Normal Perineum   Vulva: normal   Vagina:  normal mucosa, still a little yeast dc, no palpable nodules   Uterus Normal, Gravid, FH: ~7     Cervix: Unable to see 2, but possible 2nd cx palpated anterioraly behind pubic bone.  Pap collected from both areas   Adnexa: Not palpable   Urinary:  urethral meatus normal    System: Breast:  normal appearance, no masses or tenderness   Skin: normal coloration and turgor, no rashes    Neurologic: oriented, normal, normal mood   Extremities: normal strength, tone, and muscle  mass   HEENT PERRLA   Mouth/Teeth mucous membranes moist, normal dentition   Neck supple and no masses   Cardiovascular: regular rate and rhythm   Respiratory:  appears well, vitals normal, no respiratory distress, acyanotic   Abdomen: soft, non-tender;  FHR: 171 us by JAG          Assessment:    Pregnancy: G3P1011 Patient Active Problem List   Diagnosis Date Noted  . History of cesarean delivery, currently pregnant 05/16/2014  . Supervision of normal intrauterine pregnancy in multigravida in first trimester 05/16/2014  . Uterus didelphys   . Vaginal discharge during pregnancy in first trimester 05/02/2014  . Yeast infection 05/02/2014  . Candidal intertrigo 10/27/2011  . Morbid obesity 10/20/2011  . Hyperlipidemia 10/20/2011  . Anemia 10/20/2011  . Tobacco user 10/20/2011        Plan:     Initial labs drawn. Continue prenatal vitamins  Problem list reviewed and updated  Reviewed n/v relief measures and warning s/s to report  Reviewed recommended weight gain based on pre-gravid BMI  Encouraged well-balanced diet Genetic Screening discussed Integrated Screen: requested.  Ultrasound discussed; fetal survey: requested.  Follow up in 3 weeks for NT/IT.  CRESENZO-DISHMAN,[place] 05/16/2014

## 2014-05-17 LAB — URINALYSIS, ROUTINE W REFLEX MICROSCOPIC
BILIRUBIN URINE: NEGATIVE
Glucose, UA: NEGATIVE mg/dL
HGB URINE DIPSTICK: NEGATIVE
Ketones, ur: NEGATIVE mg/dL
Leukocytes, UA: NEGATIVE
NITRITE: NEGATIVE
Protein, ur: NEGATIVE mg/dL
Specific Gravity, Urine: 1.028 (ref 1.005–1.030)
Urobilinogen, UA: 1 mg/dL (ref 0.0–1.0)
pH: 5.5 (ref 5.0–8.0)

## 2014-05-17 LAB — HIV ANTIBODY (ROUTINE TESTING W REFLEX): HIV: NONREACTIVE

## 2014-05-17 LAB — RUBELLA SCREEN

## 2014-05-17 LAB — DRUG SCREEN, URINE, NO CONFIRMATION
Amphetamine Screen, Ur: NEGATIVE
BARBITURATE QUANT UR: NEGATIVE
BENZODIAZEPINES.: NEGATIVE
Cocaine Metabolites: NEGATIVE
Creatinine,U: 272.5 mg/dL
Marijuana Metabolite: NEGATIVE
Methadone: NEGATIVE
Opiate Screen, Urine: NEGATIVE
PROPOXYPHENE: NEGATIVE
Phencyclidine (PCP): NEGATIVE

## 2014-05-17 LAB — HEPATITIS B SURFACE ANTIGEN: Hepatitis B Surface Ag: NEGATIVE

## 2014-05-17 LAB — ANTIBODY SCREEN: Antibody Screen: NEGATIVE

## 2014-05-17 LAB — RPR

## 2014-05-17 LAB — OXYCODONE SCREEN, UA, RFLX CONFIRM: OXYCODONE SCRN UR: NEGATIVE ng/mL

## 2014-05-17 LAB — SICKLE CELL SCREEN: SICKLE CELL SCREEN: NEGATIVE

## 2014-05-17 LAB — VARICELLA ZOSTER ANTIBODY, IGG: VARICELLA IGG: 1401 {index} — AB (ref <135.00)

## 2014-05-17 LAB — CYSTIC FIBROSIS DIAGNOSTIC STUDY

## 2014-05-18 LAB — URINE CULTURE

## 2014-05-18 LAB — CYTOLOGY - PAP

## 2014-05-23 ENCOUNTER — Encounter: Payer: Self-pay | Admitting: Advanced Practice Midwife

## 2014-05-28 ENCOUNTER — Encounter: Payer: Self-pay | Admitting: Advanced Practice Midwife

## 2014-06-13 ENCOUNTER — Encounter: Payer: Self-pay | Admitting: Women's Health

## 2014-06-13 ENCOUNTER — Ambulatory Visit (INDEPENDENT_AMBULATORY_CARE_PROVIDER_SITE_OTHER): Payer: Medicaid Other | Admitting: Women's Health

## 2014-06-13 ENCOUNTER — Ambulatory Visit (INDEPENDENT_AMBULATORY_CARE_PROVIDER_SITE_OTHER): Payer: Medicaid Other

## 2014-06-13 ENCOUNTER — Other Ambulatory Visit: Payer: Self-pay | Admitting: Advanced Practice Midwife

## 2014-06-13 VITALS — BP 118/80 | Wt 298.0 lb

## 2014-06-13 DIAGNOSIS — Z3481 Encounter for supervision of other normal pregnancy, first trimester: Secondary | ICD-10-CM

## 2014-06-13 DIAGNOSIS — O26899 Other specified pregnancy related conditions, unspecified trimester: Secondary | ICD-10-CM

## 2014-06-13 DIAGNOSIS — Z3682 Encounter for antenatal screening for nuchal translucency: Secondary | ICD-10-CM

## 2014-06-13 DIAGNOSIS — Z6791 Unspecified blood type, Rh negative: Secondary | ICD-10-CM | POA: Insufficient documentation

## 2014-06-13 DIAGNOSIS — Z36 Encounter for antenatal screening of mother: Secondary | ICD-10-CM

## 2014-06-13 DIAGNOSIS — O34219 Maternal care for unspecified type scar from previous cesarean delivery: Secondary | ICD-10-CM

## 2014-06-13 DIAGNOSIS — O360111 Maternal care for anti-D [Rh] antibodies, first trimester, fetus 1: Secondary | ICD-10-CM

## 2014-06-13 DIAGNOSIS — Z3491 Encounter for supervision of normal pregnancy, unspecified, first trimester: Secondary | ICD-10-CM

## 2014-06-13 DIAGNOSIS — Z331 Pregnant state, incidental: Secondary | ICD-10-CM

## 2014-06-13 DIAGNOSIS — Z1389 Encounter for screening for other disorder: Secondary | ICD-10-CM

## 2014-06-13 LAB — POCT URINALYSIS DIPSTICK
Blood, UA: NEGATIVE
Glucose, UA: NEGATIVE
Leukocytes, UA: NEGATIVE
NITRITE UA: NEGATIVE
Protein, UA: NEGATIVE

## 2014-06-13 NOTE — Progress Notes (Signed)
Low-risk OB appointment G3P1011 4369w5d Estimated Date of Delivery: 12/28/14 BP 118/80 mmHg  Wt 298 lb (135.172 kg)  LMP 03/08/2014  BP, weight, and urine reviewed.  Refer to obstetrical flow sheet for FH & FHR.  No fm yet. Denies cramping, lof, vb, or uti s/s. No complaints. Reviewed today's normal NT u/s, warning s/s to report. Plan:  Continue routine obstetrical care  F/U in 4wks for OB appointment and 2nd IT 1st IT/NT today

## 2014-06-13 NOTE — Patient Instructions (Signed)
First Trimester of Pregnancy The first trimester of pregnancy is from week 1 until the end of week 12 (months 1 through 3). A week after a sperm fertilizes an egg, the egg will implant on the wall of the uterus. This embryo will begin to develop into a baby. Genes from you and your partner are forming the baby. The female genes determine whether the baby is a boy or a girl. At 6-8 weeks, the eyes and face are formed, and the heartbeat can be seen on ultrasound. At the end of 12 weeks, all the baby's organs are formed.  Now that you are pregnant, you will want to do everything you can to have a healthy baby. Two of the most important things are to get good prenatal care and to follow your health care provider's instructions. Prenatal care is all the medical care you receive before the baby's birth. This care will help prevent, find, and treat any problems during the pregnancy and childbirth. BODY CHANGES Your body goes through many changes during pregnancy. The changes vary from woman to woman.   You may gain or lose a couple of pounds at first.  You may feel sick to your stomach (nauseous) and throw up (vomit). If the vomiting is uncontrollable, call your health care provider.  You may tire easily.  You may develop headaches that can be relieved by medicines approved by your health care provider.  You may urinate more often. Painful urination may mean you have a bladder infection.  You may develop heartburn as a result of your pregnancy.  You may develop constipation because certain hormones are causing the muscles that push waste through your intestines to slow down.  You may develop hemorrhoids or swollen, bulging veins (varicose veins).  Your breasts may begin to grow larger and become tender. Your nipples may stick out more, and the tissue that surrounds them (areola) may become darker.  Your gums may bleed and may be sensitive to brushing and flossing.  Dark spots or blotches (chloasma,  mask of pregnancy) may develop on your face. This will likely fade after the baby is born.  Your menstrual periods will stop.  You may have a loss of appetite.  You may develop cravings for certain kinds of food.  You may have changes in your emotions from day to day, such as being excited to be pregnant or being concerned that something may go wrong with the pregnancy and baby.  You may have more vivid and strange dreams.  You may have changes in your hair. These can include thickening of your hair, rapid growth, and changes in texture. Some women also have hair loss during or after pregnancy, or hair that feels dry or thin. Your hair will most likely return to normal after your baby is born. WHAT TO EXPECT AT YOUR PRENATAL VISITS During a routine prenatal visit:  You will be weighed to make sure you and the baby are growing normally.  Your blood pressure will be taken.  Your abdomen will be measured to track your baby's growth.  The fetal heartbeat will be listened to starting around week 10 or 12 of your pregnancy.  Test results from any previous visits will be discussed. Your health care provider may ask you:  How you are feeling.  If you are feeling the baby move.  If you have had any abnormal symptoms, such as leaking fluid, bleeding, severe headaches, or abdominal cramping.  If you have any questions. Other tests   that may be performed during your first trimester include:  Blood tests to find your blood type and to check for the presence of any previous infections. They will also be used to check for low iron levels (anemia) and Rh antibodies. Later in the pregnancy, blood tests for diabetes will be done along with other tests if problems develop.  Urine tests to check for infections, diabetes, or protein in the urine.  An ultrasound to confirm the proper growth and development of the baby.  An amniocentesis to check for possible genetic problems.  Fetal screens for  spina bifida and Down syndrome.  You may need other tests to make sure you and the baby are doing well. HOME CARE INSTRUCTIONS  Medicines  Follow your health care provider's instructions regarding medicine use. Specific medicines may be either safe or unsafe to take during pregnancy.  Take your prenatal vitamins as directed.  If you develop constipation, try taking a stool softener if your health care provider approves. Diet  Eat regular, well-balanced meals. Choose a variety of foods, such as meat or vegetable-based protein, fish, milk and low-fat dairy products, vegetables, fruits, and whole grain breads and cereals. Your health care provider will help you determine the amount of weight gain that is right for you.  Avoid raw meat and uncooked cheese. These carry germs that can cause birth defects in the baby.  Eating four or five small meals rather than three large meals a day may help relieve nausea and vomiting. If you start to feel nauseous, eating a few soda crackers can be helpful. Drinking liquids between meals instead of during meals also seems to help nausea and vomiting.  If you develop constipation, eat more high-fiber foods, such as fresh vegetables or fruit and whole grains. Drink enough fluids to keep your urine clear or pale yellow. Activity and Exercise  Exercise only as directed by your health care provider. Exercising will help you:  Control your weight.  Stay in shape.  Be prepared for labor and delivery.  Experiencing pain or cramping in the lower abdomen or low back is a good sign that you should stop exercising. Check with your health care provider before continuing normal exercises.  Try to avoid standing for long periods of time. Move your legs often if you must stand in one place for a long time.  Avoid heavy lifting.  Wear low-heeled shoes, and practice good posture.  You may continue to have sex unless your health care provider directs you  otherwise. Relief of Pain or Discomfort  Wear a good support bra for breast tenderness.   Take warm sitz baths to soothe any pain or discomfort caused by hemorrhoids. Use hemorrhoid cream if your health care provider approves.   Rest with your legs elevated if you have leg cramps or low back pain.  If you develop varicose veins in your legs, wear support hose. Elevate your feet for 15 minutes, 3-4 times a day. Limit salt in your diet. Prenatal Care  Schedule your prenatal visits by the twelfth week of pregnancy. They are usually scheduled monthly at first, then more often in the last 2 months before delivery.  Write down your questions. Take them to your prenatal visits.  Keep all your prenatal visits as directed by your health care provider. Safety  Wear your seat belt at all times when driving.  Make a list of emergency phone numbers, including numbers for family, friends, the hospital, and police and fire departments. General Tips    Ask your health care provider for a referral to a local prenatal education class. Begin classes no later than at the beginning of month 6 of your pregnancy.  Ask for help if you have counseling or nutritional needs during pregnancy. Your health care provider can offer advice or refer you to specialists for help with various needs.  Do not use hot tubs, steam rooms, or saunas.  Do not douche or use tampons or scented sanitary pads.  Do not cross your legs for long periods of time.  Avoid cat litter boxes and soil used by cats. These carry germs that can cause birth defects in the baby and possibly loss of the fetus by miscarriage or stillbirth.  Avoid all smoking, herbs, alcohol, and medicines not prescribed by your health care provider. Chemicals in these affect the formation and growth of the baby.  Schedule a dentist appointment. At home, brush your teeth with a soft toothbrush and be gentle when you floss. SEEK MEDICAL CARE IF:   You have  dizziness.  You have mild pelvic cramps, pelvic pressure, or nagging pain in the abdominal area.  You have persistent nausea, vomiting, or diarrhea.  You have a bad smelling vaginal discharge.  You have pain with urination.  You notice increased swelling in your face, hands, legs, or ankles. SEEK IMMEDIATE MEDICAL CARE IF:   You have a fever.  You are leaking fluid from your vagina.  You have spotting or bleeding from your vagina.  You have severe abdominal cramping or pain.  You have rapid weight gain or loss.  You vomit blood or material that looks like coffee grounds.  You are exposed to German measles and have never had them.  You are exposed to fifth disease or chickenpox.  You develop a severe headache.  You have shortness of breath.  You have any kind of trauma, such as from a fall or a car accident. Document Released: 07/07/2001 Document Revised: 11/27/2013 Document Reviewed: 05/23/2013 [employer] Patient Information 2015 [employer]. This information is not intended to replace advice given to you by your health care provider. Make sure you discuss any questions you have with your health care provider.  

## 2014-06-13 NOTE — Progress Notes (Signed)
U/S(11+5wks)-IUP within Rt uterus, single active fetus, CRL c/w dates, fluid WNL, fundal Gr 0 placenta, cx appears closed, FHR-153 bpm, NB present, NT-1.336mm

## 2014-06-19 LAB — MATERNAL SCREEN, INTEGRATED #1

## 2014-06-25 ENCOUNTER — Telehealth: Payer: Self-pay | Admitting: Women's Health

## 2014-06-25 MED ORDER — FLUCONAZOLE 150 MG PO TABS: 150.0000 mg | ORAL_TABLET | Freq: Once | ORAL | Status: DC

## 2014-06-25 NOTE — Telephone Encounter (Signed)
Pt states she was treated twice in October for a yeast infection and is now feeling like she has one again.  She has been having itching and irritation X 1 week, not having a d/c.  Pt is wanting to see if we would give her a refill on the Diflucan without her having to come into the office.

## 2014-06-25 NOTE — Telephone Encounter (Signed)
Pt informed medication was sent to pharmacy and after 2nd dose if still having issue will need to come in for office visit.

## 2014-07-10 ENCOUNTER — Telehealth: Payer: Medicaid Other | Admitting: Nurse Practitioner

## 2014-07-10 DIAGNOSIS — J019 Acute sinusitis, unspecified: Secondary | ICD-10-CM

## 2014-07-10 NOTE — Progress Notes (Signed)
We are sorry that you are not feeling well.  Here is how we plan to help!  Based on what you have shared with me it looks like you will need a face to face visit for complicated/severe symptoms which could represent a more serious problem. We are not allow to treat pregnant women through e visits , so you will need to see your PCP or your Ob. I do apologize for any inconvience.  If you are having a true medical emergency please call 911.  If you need an urgent face to face visit, [place] has four urgent care centers for your convenience.  Amanda Sanchez. [facility]  [phone] Get Driving Directions Find a Provider at this Location  [address] [place], [place] [address] . 8 am to 8 pm Monday-Friday . 9 am to 7 pm Saturday-Sunday  . [facility]  [phone] Get Driving Directions Find a Provider at this Location  [address] [place], [place] [address] . 8 am to 8 pm Monday-Friday . 9 am to 6 pm Saturday . 11 am to 6 pm Sunday   . [facility]  [phone] Get Driving Directions  [address] [place], [place] [address] . 8 am to 8 pm Monday-Friday . 9 am to 4 pm Saturday-Sunday   . [facility] (a walk in primary care provider)  [phone]  Get Driving Directions Find a Provider at this Location  [address] [place], [place] [address] . 8 am to 8:30 pm Monday-Thursday . 8 am to 6 pm Friday . 8 am to 4 pm Saturday-Sunday  Your e-visit answers were reviewed by a board certified advanced clinical practitioner to complete your personal care plan.  Depending on the condition, your plan could have included both over the counter or prescription medications.  You will get an e-mail in the next two days asking about your experience.  I hope that your e-visit has been valuable and will speed your recovery . Thank you for choosing an e-visit.

## 2014-07-11 ENCOUNTER — Encounter: Payer: Self-pay | Admitting: Obstetrics and Gynecology

## 2014-07-11 ENCOUNTER — Ambulatory Visit (INDEPENDENT_AMBULATORY_CARE_PROVIDER_SITE_OTHER): Payer: Medicaid Other | Admitting: Obstetrics and Gynecology

## 2014-07-11 DIAGNOSIS — Z331 Pregnant state, incidental: Secondary | ICD-10-CM

## 2014-07-11 DIAGNOSIS — Z3491 Encounter for supervision of normal pregnancy, unspecified, first trimester: Secondary | ICD-10-CM

## 2014-07-11 DIAGNOSIS — Z1389 Encounter for screening for other disorder: Secondary | ICD-10-CM

## 2014-07-11 DIAGNOSIS — Z369 Encounter for antenatal screening, unspecified: Secondary | ICD-10-CM

## 2014-07-11 LAB — POCT URINALYSIS DIPSTICK
GLUCOSE UA: NEGATIVE
Ketones, UA: NEGATIVE
Leukocytes, UA: NEGATIVE
Nitrite, UA: NEGATIVE
Protein, UA: NEGATIVE
RBC UA: NEGATIVE

## 2014-07-11 NOTE — Progress Notes (Signed)
Pt states that she has been having headaches every other day, over her left eye and they last for hours. Pt states that she has also had left ear pain.

## 2014-07-11 NOTE — Progress Notes (Addendum)
[id number] Estimated Date of Delivery: 12/28/14  Blood pressure 120/80, weight 302 lb 8 oz (137.213 kg), last menstrual period 03/08/2014.   refer to the ob flow sheet for FH and FHR, also BP, Wt, Urine results:notable for negative  Patient reports good fetal movement, denies any bleeding and no rupture of membranes symptoms or regular contractions. Patient complaints: G3P1011. She notes that this current pregnancy will be her last one. She is thinking about permanent sterilization for her contraceptive measures. She voices that her nausea and vomiting is now gone. Weight gain noted. She denies any other symptoms.  Fetal Heart Rate: 149 Fundal Height: not indicated  Questions were answered.  Assessment:  1. Didelphys uterus with 2 cervices  2. Repeat C-Section? 3. Low risk OB  Plan:   1. Continued routine obstetrical care 2. Lengthy discussion about contraceptive measures with patient 3. Second IT to be completed today 4. Lengthy discussion about repeat C-section vs SVD, probable C-section 5. F/U for internal exam before considering VBAC further.  F/u in 4 weeks weeks for routine OB  This chart was scribed for Tilda BurrowJohn V. Genevia Bouldin, MD by Chestine SporeSoijett Blue, ED Scribe. The patient was seen in room 1 at 1:50 PM.

## 2014-07-18 ENCOUNTER — Telehealth: Payer: Self-pay | Admitting: Women's Health

## 2014-07-18 NOTE — Telephone Encounter (Signed)
Pt states that for past 3 days had had loose bowl movements, some cramping on rt side last night and is concerned that she is not feeling the baby move yet.   Pt denies any vaginal bleeding or severe cramping.  Informed her that it is not abnormal to not be feeling the baby moving yet, she is only 16+5 and it may be closer to 20 wks.  Advised her to use tylenol for the cramping and to push fluids to stay hydrated and rest when can.  Advised pt to call back if developed new symptoms or if had any vaginal bleeding or severe cramping.  Pt verbalized understanding.

## 2014-07-19 LAB — MATERNAL SCREEN, INTEGRATED #2
AFP MoM: 1.47
AFP, SERUM MAT SCREEN: 36 ng/mL
CALCULATED GESTATIONAL AGE MAT SCREEN: 15.9
Crown Rump Length: 54.4 mm
Estriol Mom: 1.26
Estriol, Free: 0.79 ng/mL
HCG, SERUM MAT SCREEN: 27.5 [IU]/mL
INHIBIN A DIMERIC MAT SCREEN: 186 pg/mL
INHIBIN A MOM MAT SCREEN: 1.42
MSS Down Syndrome: <1:5000 {titer}
NT MOM MAT SCREEN: 1.23
Nuchal Translucency: 1.6 mm
Number of fetuses: 1
PAPP-A MOM MAT SCREEN: 0.49
PAPP-A: 173 ng/mL
hCG MoM: 0.88

## 2014-08-07 ENCOUNTER — Other Ambulatory Visit: Payer: Self-pay | Admitting: Obstetrics and Gynecology

## 2014-08-07 DIAGNOSIS — Z3689 Encounter for other specified antenatal screening: Secondary | ICD-10-CM

## 2014-08-07 DIAGNOSIS — Q5128 Other doubling of uterus, other specified: Secondary | ICD-10-CM

## 2014-08-07 DIAGNOSIS — O34592 Maternal care for other abnormalities of gravid uterus, second trimester: Secondary | ICD-10-CM

## 2014-08-08 ENCOUNTER — Encounter: Payer: Self-pay | Admitting: Advanced Practice Midwife

## 2014-08-08 ENCOUNTER — Other Ambulatory Visit: Payer: Self-pay | Admitting: Obstetrics and Gynecology

## 2014-08-08 ENCOUNTER — Ambulatory Visit (INDEPENDENT_AMBULATORY_CARE_PROVIDER_SITE_OTHER): Payer: Medicaid Other

## 2014-08-08 ENCOUNTER — Ambulatory Visit (INDEPENDENT_AMBULATORY_CARE_PROVIDER_SITE_OTHER): Payer: Medicaid Other | Admitting: Advanced Practice Midwife

## 2014-08-08 VITALS — BP 118/72 | Wt 307.0 lb

## 2014-08-08 DIAGNOSIS — Z9889 Other specified postprocedural states: Secondary | ICD-10-CM

## 2014-08-08 DIAGNOSIS — Z98891 History of uterine scar from previous surgery: Secondary | ICD-10-CM

## 2014-08-08 DIAGNOSIS — O34592 Maternal care for other abnormalities of gravid uterus, second trimester: Secondary | ICD-10-CM

## 2014-08-08 DIAGNOSIS — Z36 Encounter for antenatal screening of mother: Secondary | ICD-10-CM

## 2014-08-08 DIAGNOSIS — Q5128 Other doubling of uterus, other specified: Secondary | ICD-10-CM

## 2014-08-08 DIAGNOSIS — Z1389 Encounter for screening for other disorder: Secondary | ICD-10-CM

## 2014-08-08 DIAGNOSIS — Z3689 Encounter for other specified antenatal screening: Secondary | ICD-10-CM

## 2014-08-08 DIAGNOSIS — Z3492 Encounter for supervision of normal pregnancy, unspecified, second trimester: Secondary | ICD-10-CM

## 2014-08-08 DIAGNOSIS — O09292 Supervision of pregnancy with other poor reproductive or obstetric history, second trimester: Secondary | ICD-10-CM

## 2014-08-08 DIAGNOSIS — Z331 Pregnant state, incidental: Secondary | ICD-10-CM

## 2014-08-08 DIAGNOSIS — Q512 Other doubling of uterus: Secondary | ICD-10-CM

## 2014-08-08 LAB — POCT URINALYSIS DIPSTICK
Glucose, UA: NEGATIVE
Ketones, UA: NEGATIVE
Leukocytes, UA: NEGATIVE
Nitrite, UA: NEGATIVE
Protein, UA: NEGATIVE
RBC UA: NEGATIVE

## 2014-08-08 NOTE — Progress Notes (Signed)
Pt denies any problems or concerns at this time.  

## 2014-08-08 NOTE — Progress Notes (Signed)
[id number] [id number] Estimated Date of Delivery: 12/28/14  Blood pressure 118/72, weight 307 lb (139.254 kg), last menstrual period 03/08/2014.   BP weight and urine results all reviewed and noted.  Please refer to the obstetrical flow sheet for the fundal height and fetal heart rate documentation: had anatomy scan today:  U/S(19+5wks)-IUP within Rt uterus, active fetus, meas c/w dates, fluid wnl, fundal Gr 0 placenta,Rt cx appears closed - 4.1cm, bilateral adnexa appears WNL, LT uterus appears anteverted, no obvious abnl noted on fetal anatomy screen, female fetus, FHR-161 bpm, difficult exam due to didelphys uterus and maternal body habitus  Patient reports good fetal movement, denies any bleeding and no rupture of membranes symptoms or regular contractions. Patient is without complaints. All questions were answered.  Plan:  Continued routine obstetrical care,   Follow up in 4 weeks for OB appointment,

## 2014-08-08 NOTE — Progress Notes (Signed)
U/S(19+5wks)-IUP within Rt uterus, active fetus, meas c/w dates, fluid wnl, fundal Gr 0 placenta,Rt cx appears closed - 4.1cm, bilateral adnexa appears WNL, LT uterus appears anteverted, no obvious abnl noted on fetal anatomy screen, female fetus, FHR-161 bpm, difficult exam due to didelphys uterus and maternal body habitus

## 2014-09-05 ENCOUNTER — Encounter: Payer: Medicaid Other | Admitting: Women's Health

## 2014-09-05 ENCOUNTER — Telehealth: Payer: Self-pay | Admitting: Advanced Practice Midwife

## 2014-09-05 ENCOUNTER — Encounter: Payer: Self-pay | Admitting: Women's Health

## 2014-09-05 ENCOUNTER — Ambulatory Visit (INDEPENDENT_AMBULATORY_CARE_PROVIDER_SITE_OTHER): Payer: Medicaid Other | Admitting: Women's Health

## 2014-09-05 VITALS — BP 122/78 | Wt 308.0 lb

## 2014-09-05 DIAGNOSIS — Z331 Pregnant state, incidental: Secondary | ICD-10-CM

## 2014-09-05 DIAGNOSIS — Z1389 Encounter for screening for other disorder: Secondary | ICD-10-CM

## 2014-09-05 DIAGNOSIS — Z3492 Encounter for supervision of normal pregnancy, unspecified, second trimester: Secondary | ICD-10-CM

## 2014-09-05 LAB — POCT URINALYSIS DIPSTICK
Blood, UA: NEGATIVE
Glucose, UA: NEGATIVE
Ketones, UA: NEGATIVE
Leukocytes, UA: NEGATIVE
NITRITE UA: NEGATIVE
PROTEIN UA: NEGATIVE

## 2014-09-05 NOTE — Patient Instructions (Signed)
You will have your sugar test next visit.  Please do not eat or drink anything after midnight the night before you come, not even water.  You will be here for at least two hours.      Pediatricians:  [facility] [phone]            [facility] [phone]                 [facility] [phone] (usually doesn't accept new patients unless you have family there already, you are always welcome to call and ask)             [facility] ([address] [place]) [phone]   [facility] Pediatricians:   [facility]: [phone]  [facility]: [phone]    Call the office [phone]) or go to [facility] if:  You begin to have strong, frequent contractions  Your water breaks.  Sometimes it is a big gush of fluid, sometimes it is just a trickle that keeps getting your panties wet or running down your legs  You have vaginal bleeding.  It is normal to have a small amount of spotting if your cervix was checked.   You don't feel your baby moving like normal.  If you don't, get you something to eat and drink and lay down and focus on feeling your baby move.   If your baby is still not moving like normal, , you should call the office or go to [facility].    Second Trimester of Pregnancy The second trimester is from week 13 through week 28, months 4 through 6. The second trimester is often a time when you feel your best. Your body has also adjusted to being pregnant, and you begin to feel better physically. Usually, morning sickness has lessened or quit completely, you may have more energy, and you may have an increase in appetite. The second trimester is also a time when the fetus is growing rapidly. At the end of the sixth month, the fetus is about 9 inches long and weighs about 1 pounds. You will likely begin to feel the baby move (quickening) between 18 and 20 weeks of the  pregnancy. BODY CHANGES Your body goes through many changes during pregnancy. The changes vary from woman to woman.   Your weight will continue to increase. You will notice your lower abdomen bulging out.  You may begin to get stretch marks on your hips, abdomen, and breasts.  You may develop headaches that can be relieved by medicines approved by your health care provider.  You may urinate more often because the fetus is pressing on your bladder.  You may develop or continue to have heartburn as a result of your pregnancy.  You may develop constipation because certain hormones are causing the muscles that push waste through your intestines to slow down.  You may develop hemorrhoids or swollen, bulging veins (varicose veins).  You may have back pain because of the weight gain and pregnancy hormones relaxing your joints between the bones in your pelvis and as a result of a shift in weight and the muscles that support your balance.  Your breasts will continue to grow and be tender.  Your gums may bleed and may be sensitive to brushing and flossing.  Dark spots or blotches (chloasma, mask of pregnancy) may develop on your face. This will likely fade after the baby is born.  A dark line from your  belly button to the pubic area (linea nigra) may appear. This will likely fade after the baby is born.  You may have changes in your hair. These can include thickening of your hair, rapid growth, and changes in texture. Some women also have hair loss during or after pregnancy, or hair that feels dry or thin. Your hair will most likely return to normal after your baby is born. WHAT TO EXPECT AT YOUR PRENATAL VISITS During a routine prenatal visit:  You will be weighed to make sure you and the fetus are growing normally.  Your blood pressure will be taken.  Your abdomen will be measured to track your baby's growth.  The fetal heartbeat will be listened to.  Any test results from the  previous visit will be discussed. Your health care provider may ask you:  How you are feeling.  If you are feeling the baby move.  If you have had any abnormal symptoms, such as leaking fluid, bleeding, severe headaches, or abdominal cramping.  If you have any questions. Other tests that may be performed during your second trimester include:  Blood tests that check for:  Low iron levels (anemia).  Gestational diabetes (between 24 and 28 weeks).  Rh antibodies.  Urine tests to check for infections, diabetes, or protein in the urine.  An ultrasound to confirm the proper growth and development of the baby.  An amniocentesis to check for possible genetic problems.  Fetal screens for spina bifida and Down syndrome. HOME CARE INSTRUCTIONS   Avoid all smoking, herbs, alcohol, and unprescribed drugs. These chemicals affect the formation and growth of the baby.  Follow your health care provider's instructions regarding medicine use. There are medicines that are either safe or unsafe to take during pregnancy.  Exercise only as directed by your health care provider. Experiencing uterine cramps is a good sign to stop exercising.  Continue to eat regular, healthy meals.  Wear a good support bra for breast tenderness.  Do not use hot tubs, steam rooms, or saunas.  Wear your seat belt at all times when driving.  Avoid raw meat, uncooked cheese, cat litter boxes, and soil used by cats. These carry germs that can cause birth defects in the baby.  Take your prenatal vitamins.  Try taking a stool softener (if your health care provider approves) if you develop constipation. Eat more high-fiber foods, such as fresh vegetables or fruit and whole grains. Drink plenty of fluids to keep your urine clear or pale yellow.  Take warm sitz baths to soothe any pain or discomfort caused by hemorrhoids. Use hemorrhoid cream if your health care provider approves.  If you develop varicose veins, wear  support hose. Elevate your feet for 15 minutes, 3-4 times a day. Limit salt in your diet.  Avoid heavy lifting, wear low heel shoes, and practice good posture.  Rest with your legs elevated if you have leg cramps or low back pain.  Visit your dentist if you have not gone yet during your pregnancy. Use a soft toothbrush to brush your teeth and be gentle when you floss.  A sexual relationship may be continued unless your health care provider directs you otherwise.  Continue to go to all your prenatal visits as directed by your health care provider. SEEK MEDICAL CARE IF:   You have dizziness.  You have mild pelvic cramps, pelvic pressure, or nagging pain in the abdominal area.  You have persistent nausea, vomiting, or diarrhea.  You have a bad smelling  vaginal discharge.  You have pain with urination. SEEK IMMEDIATE MEDICAL CARE IF:   You have a fever.  You are leaking fluid from your vagina.  You have spotting or bleeding from your vagina.  You have severe abdominal cramping or pain.  You have rapid weight gain or loss.  You have shortness of breath with chest pain.  You notice sudden or extreme swelling of your face, hands, ankles, feet, or legs.  You have not felt your baby move in over an hour.  You have severe headaches that do not go away with medicine.  You have vision changes. Document Released: 07/07/2001 Document Revised: 07/18/2013 Document Reviewed: 09/13/2012 [facility] Patient Information 2015 [employer] [place]. This information is not intended to replace advice given to you by your health care provider. Make sure you discuss any questions you have with your health care provider.

## 2014-09-05 NOTE — Telephone Encounter (Signed)
Pt was seen this morning and forgot to ask for a note stating that it is OK to fly.  [place] and wants to have a letter stating she is medically cleared to fly just incase there are questions when she is at the airport.  Her next appointment with us is scheduled on 10/03/14 after her return.

## 2014-09-05 NOTE — Telephone Encounter (Signed)
Pt informed of Kim's instructions and verbalized understanding. 

## 2014-09-05 NOTE — Progress Notes (Signed)
Low-risk OB appointment [id number] Estimated Date of Delivery: 12/28/14 BP 122/78 mmHg  Wt 308 lb (139.708 kg)  LMP 03/08/2014  BP, weight, and urine reviewed.  Refer to obstetrical flow sheet for FH & FHR.  Reports good fm.  Denies regular uc's, lof, vb, or uti s/s. No complaints. Reviewed ptl s/s, fm. Plan:  Continue routine obstetrical care  F/U in 4wks for OB appointment and pn2

## 2014-09-26 ENCOUNTER — Telehealth: Payer: Self-pay | Admitting: *Deleted

## 2014-09-26 NOTE — Telephone Encounter (Signed)
Pt states Tums does not help the heart burn she is having. Pt informed is safe to take OTC Zantac per Dr. Emelda FearFerguson. If no improvement pt to call our office back. Pt verbalized understanding.

## 2014-10-01 ENCOUNTER — Telehealth: Payer: Self-pay | Admitting: *Deleted

## 2014-10-01 NOTE — Telephone Encounter (Signed)
Pt states flew to [place] (5 hour flight) took Aspirin and stretched as advised, but now having right knee pain with some heat to touch. Pain mostly behind the right knee. Per Cyril MourningJennifer Griffin, NP pt to go to ER to be evaluated. Pt is still in [place]. Pt verbalized understanding.

## 2014-10-02 ENCOUNTER — Emergency Department: Payer: Self-pay | Admitting: Emergency Medicine

## 2014-10-03 ENCOUNTER — Ambulatory Visit (INDEPENDENT_AMBULATORY_CARE_PROVIDER_SITE_OTHER): Payer: Medicaid Other | Admitting: Advanced Practice Midwife

## 2014-10-03 ENCOUNTER — Other Ambulatory Visit: Payer: Medicaid Other

## 2014-10-03 VITALS — BP 134/86

## 2014-10-03 DIAGNOSIS — Z3483 Encounter for supervision of other normal pregnancy, third trimester: Secondary | ICD-10-CM

## 2014-10-03 DIAGNOSIS — Z131 Encounter for screening for diabetes mellitus: Secondary | ICD-10-CM

## 2014-10-03 DIAGNOSIS — Z1389 Encounter for screening for other disorder: Secondary | ICD-10-CM

## 2014-10-03 DIAGNOSIS — Z113 Encounter for screening for infections with a predominantly sexual mode of transmission: Secondary | ICD-10-CM

## 2014-10-03 DIAGNOSIS — Z114 Encounter for screening for human immunodeficiency virus [HIV]: Secondary | ICD-10-CM

## 2014-10-03 DIAGNOSIS — Z3492 Encounter for supervision of normal pregnancy, unspecified, second trimester: Secondary | ICD-10-CM

## 2014-10-03 DIAGNOSIS — Z0184 Encounter for antibody response examination: Secondary | ICD-10-CM

## 2014-10-03 DIAGNOSIS — Z331 Pregnant state, incidental: Secondary | ICD-10-CM

## 2014-10-03 DIAGNOSIS — O368123 Decreased fetal movements, second trimester, fetus 3: Secondary | ICD-10-CM

## 2014-10-03 LAB — POCT URINALYSIS DIPSTICK
Blood, UA: NEGATIVE
GLUCOSE UA: NEGATIVE
Ketones, UA: NEGATIVE
Leukocytes, UA: NEGATIVE
NITRITE UA: NEGATIVE

## 2014-10-03 NOTE — Patient Instructions (Signed)
Why am I having leg cramps during pregnancy? ° °No one really knows why pregnant women get more leg cramps. It's possible that your leg muscles are tired from carrying around all of your extra weight. Or they may be aggravated by the pressure your expanding uterus puts on the blood vessels that return blood from your legs to your heart and the nerves that lead from your trunk to your legs. ° °Leg cramps may start to plague you during your second trimester and may get worse as your pregnancy progresses and your belly gets bigger. While these cramps can occur during the day, you'll probably notice them most at night, when they can interfere with your ability to get a good night's sleep. ° °How can I prevent leg cramps? ° °Try these tips for keeping leg cramps at bay: ° °Avoid standing or sitting with your legs crossed for long periods of time. °Stretch your calf muscles regularly during the day and several times before you go to bed. °Rotate your ankles and wiggle your toes when you sit, eat dinner, or watch TV. °Take a walk every day, unless your midwife or doctor has advised you not to exercise. °Avoid getting too tired. Lie down on your left side to improve circulation to and from your legs. °Stay hydrated during the day by drinking water regularly. °Try a warm bath before bed to relax your muscles. °Some research suggests that taking a magnesium supplement in addition to a prenatal vitamin may help some women avoid leg cramps. However, other research showed that magnesium supplements had no significant effect on the frequency or intensity of leg cramps during pregnancy.You may have heard that having leg cramps is a sign that you need more calcium, and that calcium supplements will relieve the problem. Though it's certainly important to get enough calcium, there's no good evidence that taking extra calcium will help prevent leg cramps during pregnancy. In fact, in one well-designed study, pregnant women taking  calcium got no more relief from leg cramps than those taking a placebo. ° °You may try Chelated Magnesium at a dosage of 240-300mg/day. ° °What's the best way to relieve a cramp when I get one? ° °If you do get a cramp, immediately stretch your calf muscles: Straighten your leg, heel first, and gently flex your toes back toward your shins. It might hurt at first, but it will ease the spasm and the pain will gradually go away. ° °You can try to relax the cramp by massaging the muscle or warming it with a hot water bottle. Walking around for a few minutes may help too. ° °What if the pain persists? ° °Call your practitioner if your muscle pain is constant and not just an occasional cramp or if you notice swelling, redness, or tenderness in your leg, or the area feels warm to your touch. These may be signs of a blood clot, which requires immediate medical attention. Blood clots are relatively rare, but they're more common during pregnancy. °  °  °  ° °

## 2014-10-03 NOTE — Progress Notes (Signed)
[id number] 6240w5d Estimated Date of Delivery: 12/28/14  Blood pressure 134/86, last menstrual period 03/08/2014.   BP weight and urine results all reviewed and noted.  Please refer to the obstetrical flow sheet for the fundal height and fetal heart rate documentation:  Patient reports good fetal movement, denies any bleeding and no rupture of membranes symptoms or regular contractions. Patient flew to [place] last week. Took ASA and stretched during flight.  Had RLE pain behind knee a few days later that has gotten progressively worse.  Last night felt a "pop" and now can't bear weight without pain. Went to [facility] yesterday when she got home.  Had a doppler that was negative.  Today, both calves measure 45 cms, no erythema and cool to touch.    C/O decreased fetal movement for a few days.  NST reassuring with 10X10 accels.  Pt feeling movement now.   All questions were answered.  Plan:  Continued routine obstetrical care, try heat/ice/support hose.  Advised to f/u if sx get worse. Doing PN2 today  Follow up in 3 weeks for OB appointment,

## 2014-10-04 LAB — CBC
HCT: 32.7 % — ABNORMAL LOW (ref 34.0–46.6)
Hemoglobin: 11.1 g/dL (ref 11.1–15.9)
MCH: 26.9 pg (ref 26.6–33.0)
MCHC: 33.9 g/dL (ref 31.5–35.7)
MCV: 79 fL (ref 79–97)
Platelets: 249 10*3/uL (ref 150–379)
RBC: 4.12 x10E6/uL (ref 3.77–5.28)
RDW: 13.8 % (ref 12.3–15.4)
WBC: 9.3 10*3/uL (ref 3.4–10.8)

## 2014-10-04 LAB — HSV 2 ANTIBODY, IGG

## 2014-10-04 LAB — ANTIBODY SCREEN: ANTIBODY SCREEN: NEGATIVE

## 2014-10-04 LAB — HIV ANTIBODY (ROUTINE TESTING W REFLEX): HIV SCREEN 4TH GENERATION: NONREACTIVE

## 2014-10-04 LAB — RPR: RPR Ser Ql: NONREACTIVE

## 2014-10-04 LAB — GLUCOSE TOLERANCE, 2 HOURS W/ 1HR
GLUCOSE, 1 HOUR: 123 mg/dL (ref 65–179)
Glucose, 2 hour: 99 mg/dL (ref 65–152)
Glucose, Fasting: 90 mg/dL (ref 65–91)

## 2014-10-10 ENCOUNTER — Telehealth: Payer: Self-pay | Admitting: Advanced Practice Midwife

## 2014-10-10 NOTE — Telephone Encounter (Signed)
RX FOR ted hose, panty hose type.  Pt aware

## 2014-10-11 ENCOUNTER — Encounter (HOSPITAL_COMMUNITY): Payer: Self-pay

## 2014-10-23 ENCOUNTER — Encounter: Payer: Self-pay | Admitting: Obstetrics and Gynecology

## 2014-10-23 ENCOUNTER — Ambulatory Visit (INDEPENDENT_AMBULATORY_CARE_PROVIDER_SITE_OTHER): Payer: Medicaid Other | Admitting: Obstetrics and Gynecology

## 2014-10-23 VITALS — BP 120/78 | HR 100 | Wt 309.0 lb

## 2014-10-23 DIAGNOSIS — Z1389 Encounter for screening for other disorder: Secondary | ICD-10-CM

## 2014-10-23 DIAGNOSIS — Z331 Pregnant state, incidental: Secondary | ICD-10-CM

## 2014-10-23 DIAGNOSIS — O360931 Maternal care for other rhesus isoimmunization, third trimester, fetus 1: Secondary | ICD-10-CM | POA: Diagnosis not present

## 2014-10-23 DIAGNOSIS — O99213 Obesity complicating pregnancy, third trimester: Secondary | ICD-10-CM

## 2014-10-23 LAB — POCT URINALYSIS DIPSTICK
GLUCOSE UA: NEGATIVE
Ketones, UA: NEGATIVE
Leukocytes, UA: NEGATIVE
Nitrite, UA: NEGATIVE
RBC UA: NEGATIVE

## 2014-10-23 MED ORDER — RHO D IMMUNE GLOBULIN 1500 UNIT/2ML IJ SOSY
300.0000 ug | PREFILLED_SYRINGE | Freq: Once | INTRAMUSCULAR | Status: AC
  Administered 2014-10-23: 300 ug via INTRAMUSCULAR

## 2014-10-23 NOTE — Addendum Note (Signed)
Addended by: Richardson ChiquitoRAVIS, Kadden Osterhout M on: 10/23/2014 04:26 PM   Modules accepted: Orders

## 2014-10-23 NOTE — Progress Notes (Signed)
Pt denies any problems or concerns at this time.  

## 2014-10-23 NOTE — Progress Notes (Signed)
[id number] 1761w4d Estimated Date of Delivery: 12/28/14  Blood pressure 120/78, pulse 100, weight 309 lb (140.161 kg), last menstrual period 03/08/2014.   refer to the ob flow sheet for FH and FHR, also BP, Wt, Urine results:notable for NEGATIVe  Patient reports   good fetal movement, denies any bleeding and no rupture of membranes symptoms or regular contractions. Patient complaints:none. No bleeding, d/c ,contractions.  Questions were answered. Assessment:  Plan:  Continued routine obstetrical care, pt desires REPEAT C/S AND BC WILL BE NUVARING  F/u in 2 weeks for U/S EFW (MATERNAL HABITUS)

## 2014-10-24 ENCOUNTER — Encounter: Payer: Medicaid Other | Admitting: Women's Health

## 2014-11-05 ENCOUNTER — Other Ambulatory Visit: Payer: Self-pay | Admitting: Obstetrics & Gynecology

## 2014-11-05 DIAGNOSIS — O99210 Obesity complicating pregnancy, unspecified trimester: Secondary | ICD-10-CM

## 2014-11-06 ENCOUNTER — Ambulatory Visit (INDEPENDENT_AMBULATORY_CARE_PROVIDER_SITE_OTHER): Payer: Medicaid Other | Admitting: Obstetrics & Gynecology

## 2014-11-06 ENCOUNTER — Ambulatory Visit (INDEPENDENT_AMBULATORY_CARE_PROVIDER_SITE_OTHER): Payer: Medicaid Other

## 2014-11-06 ENCOUNTER — Encounter: Payer: Self-pay | Admitting: Obstetrics & Gynecology

## 2014-11-06 VITALS — BP 110/70 | HR 84 | Wt 306.0 lb

## 2014-11-06 DIAGNOSIS — Z3493 Encounter for supervision of normal pregnancy, unspecified, third trimester: Secondary | ICD-10-CM

## 2014-11-06 DIAGNOSIS — O34219 Maternal care for unspecified type scar from previous cesarean delivery: Secondary | ICD-10-CM

## 2014-11-06 DIAGNOSIS — Z1389 Encounter for screening for other disorder: Secondary | ICD-10-CM

## 2014-11-06 DIAGNOSIS — O99210 Obesity complicating pregnancy, unspecified trimester: Secondary | ICD-10-CM

## 2014-11-06 DIAGNOSIS — Z331 Pregnant state, incidental: Secondary | ICD-10-CM

## 2014-11-06 LAB — POCT URINALYSIS DIPSTICK
Glucose, UA: NEGATIVE
KETONES UA: 2
Leukocytes, UA: NEGATIVE
Nitrite, UA: NEGATIVE
PROTEIN UA: NEGATIVE
RBC UA: NEGATIVE

## 2014-11-06 NOTE — Progress Notes (Signed)
US [redacted]W[redacted]D c/w dates,active fetus,breech,ant fundal pl grade 1, didelphys uterus,IUP  w/in rt uterus,left  anteverted uterus w/no obvious abn seen,EFW 1988g (4lbs6oz),afi 13.2cm

## 2014-11-06 NOTE — Progress Notes (Signed)
Sonogram good growth: Breech  G3P1011 3069w4d Estimated Date of Delivery: 12/28/14  Blood pressure 110/70, pulse 84, weight 306 lb (138.801 kg), last menstrual period 03/08/2014.   BP weight and urine results all reviewed and noted.  Please refer to the obstetrical flow sheet for the fundal height and fetal heart rate documentation:  Patient reports good fetal movement, denies any bleeding and no rupture of membranes symptoms or regular contractions. Patient is without complaints. All questions were answered.  Plan:  Continued routine obstetrical care,   Follow up in 2 weeks for OB appointment,

## 2014-11-20 ENCOUNTER — Ambulatory Visit (INDEPENDENT_AMBULATORY_CARE_PROVIDER_SITE_OTHER): Payer: Medicaid Other | Admitting: Advanced Practice Midwife

## 2014-11-20 ENCOUNTER — Encounter: Payer: Self-pay | Admitting: Advanced Practice Midwife

## 2014-11-20 VITALS — BP 110/76 | HR 84 | Wt 302.0 lb

## 2014-11-20 DIAGNOSIS — Z3493 Encounter for supervision of normal pregnancy, unspecified, third trimester: Secondary | ICD-10-CM

## 2014-11-20 DIAGNOSIS — Z1389 Encounter for screening for other disorder: Secondary | ICD-10-CM

## 2014-11-20 DIAGNOSIS — O3421 Maternal care for scar from previous cesarean delivery: Secondary | ICD-10-CM

## 2014-11-20 DIAGNOSIS — F5089 Other specified eating disorder: Secondary | ICD-10-CM | POA: Insufficient documentation

## 2014-11-20 DIAGNOSIS — O360131 Maternal care for anti-D [Rh] antibodies, third trimester, fetus 1: Secondary | ICD-10-CM

## 2014-11-20 DIAGNOSIS — O34219 Maternal care for unspecified type scar from previous cesarean delivery: Secondary | ICD-10-CM

## 2014-11-20 DIAGNOSIS — Z331 Pregnant state, incidental: Secondary | ICD-10-CM

## 2014-11-20 LAB — POCT URINALYSIS DIPSTICK
Blood, UA: NEGATIVE
Glucose, UA: NEGATIVE
Ketones, UA: NEGATIVE
Leukocytes, UA: NEGATIVE
NITRITE UA: NEGATIVE

## 2014-11-20 NOTE — Progress Notes (Signed)
Pt states that she likes to eat chalk, Pt states that she eats ice if she can't get chalk and she wants to eat dirt. Pt states that she has started having very loose stools every morning.

## 2014-11-20 NOTE — Progress Notes (Signed)
[id number] 1230w4d Estimated Date of Delivery: 12/28/14  Blood pressure 110/76, pulse 84, weight 302 lb (136.986 kg), last menstrual period 03/08/2014.   BP weight and urine results all reviewed and noted.  Please refer to the obstetrical flow sheet for the fundal height and fetal heart rate documentation:  FH increased d/t habitus. US 2 weeks ago showed normal growth/AFI  Patient reports good fetal movement, denies any bleeding and no rupture of membranes symptoms or regular contractions. Patient is without complaints. Has PICA, eats a little chalk every day.  Trying to substitue ice.  All questions were answered.  Plan:  Continued routine obstetrical care, try to resist chalk  Follow up in 2 weeks for OB appointment, Schedule CS

## 2014-12-06 ENCOUNTER — Ambulatory Visit (INDEPENDENT_AMBULATORY_CARE_PROVIDER_SITE_OTHER): Payer: Medicaid Other | Admitting: Obstetrics & Gynecology

## 2014-12-06 ENCOUNTER — Encounter: Payer: Self-pay | Admitting: Obstetrics & Gynecology

## 2014-12-06 ENCOUNTER — Encounter: Payer: Medicaid Other | Admitting: Obstetrics & Gynecology

## 2014-12-06 VITALS — BP 120/80 | HR 72 | Wt 304.0 lb

## 2014-12-06 DIAGNOSIS — Z369 Encounter for antenatal screening, unspecified: Secondary | ICD-10-CM

## 2014-12-06 DIAGNOSIS — Z3685 Encounter for antenatal screening for Streptococcus B: Secondary | ICD-10-CM

## 2014-12-06 DIAGNOSIS — Z1389 Encounter for screening for other disorder: Secondary | ICD-10-CM

## 2014-12-06 DIAGNOSIS — Z331 Pregnant state, incidental: Secondary | ICD-10-CM

## 2014-12-06 DIAGNOSIS — Z3493 Encounter for supervision of normal pregnancy, unspecified, third trimester: Secondary | ICD-10-CM

## 2014-12-06 LAB — POCT URINALYSIS DIPSTICK
Glucose, UA: NEGATIVE
KETONES UA: NEGATIVE
Leukocytes, UA: NEGATIVE
Nitrite, UA: NEGATIVE
Protein, UA: NEGATIVE
RBC UA: NEGATIVE

## 2014-12-06 NOTE — Progress Notes (Signed)
[id number] 2545w6d Estimated Date of Delivery: 12/28/14  Blood pressure 120/80, pulse 72, weight 304 lb (137.893 kg), last menstrual period 03/08/2014.   BP weight and urine results all reviewed and noted.  Please refer to the obstetrical flow sheet for the fundal height and fetal heart rate documentation:  Patient reports good fetal movement, denies any bleeding and no rupture of membranes symptoms or regular contractions. Patient is without complaints. All questions were answered.  Plan:  Continued routine obstetrical care,   Follow up in 1 weeks for OB appointment,

## 2014-12-08 LAB — GC/CHLAMYDIA PROBE AMP
CHLAMYDIA, DNA PROBE: NEGATIVE
Neisseria gonorrhoeae by PCR: NEGATIVE

## 2014-12-10 LAB — CULTURE, BETA STREP (GROUP B ONLY): STREP GP B CULTURE: POSITIVE — AB

## 2014-12-13 ENCOUNTER — Encounter: Payer: Medicaid Other | Admitting: Obstetrics & Gynecology

## 2014-12-14 ENCOUNTER — Ambulatory Visit (INDEPENDENT_AMBULATORY_CARE_PROVIDER_SITE_OTHER): Payer: Medicaid Other | Admitting: Obstetrics & Gynecology

## 2014-12-14 VITALS — BP 120/82 | HR 84 | Wt 307.5 lb

## 2014-12-14 DIAGNOSIS — Z3493 Encounter for supervision of normal pregnancy, unspecified, third trimester: Secondary | ICD-10-CM

## 2014-12-14 NOTE — Progress Notes (Signed)
[id number] 2987w0d Estimated Date of Delivery: 12/28/14  Blood pressure 120/82, pulse 84, weight 307 lb 8 oz (139.481 kg), last menstrual period 03/08/2014.   BP weight and urine results all reviewed and noted.  Please refer to the obstetrical flow sheet for the fundal height and fetal heart rate documentation:  Patient reports good fetal movement, denies any bleeding and no rupture of membranes symptoms or regular contractions. Patient is without complaints. All questions were answered.  Plan:  Continued routine obstetrical care,   Follow up in 2 weeks for OB appointment, post op  Repeat section next friday

## 2014-12-18 ENCOUNTER — Telehealth: Payer: Self-pay | Admitting: *Deleted

## 2014-12-18 NOTE — Telephone Encounter (Signed)
Pt states she was to have her c-section on Thursday, Dec 21, 2014 but has not heard anything about date for pre-op or time for the c-section. Please advise.

## 2014-12-18 NOTE — Telephone Encounter (Signed)
[facility] etc, her C section has already been set up

## 2014-12-20 ENCOUNTER — Other Ambulatory Visit: Payer: Self-pay | Admitting: Obstetrics & Gynecology

## 2014-12-20 ENCOUNTER — Encounter (HOSPITAL_COMMUNITY)
Admission: RE | Admit: 2014-12-20 | Discharge: 2014-12-20 | Disposition: A | Discharge status: Home / Self Care | Payer: Medicaid Other | Source: Ambulatory Visit | Attending: Obstetrics & Gynecology | Admitting: Obstetrics & Gynecology

## 2014-12-20 ENCOUNTER — Encounter (HOSPITAL_COMMUNITY): Payer: Self-pay

## 2014-12-20 VITALS — BP 118/63 | HR 112 | Resp 20 | Ht 62.5 in | Wt 306.0 lb

## 2014-12-20 DIAGNOSIS — Z3493 Encounter for supervision of normal pregnancy, unspecified, third trimester: Secondary | ICD-10-CM

## 2014-12-20 DIAGNOSIS — F5089 Other specified eating disorder: Secondary | ICD-10-CM

## 2014-12-20 DIAGNOSIS — O34219 Maternal care for unspecified type scar from previous cesarean delivery: Secondary | ICD-10-CM

## 2014-12-20 HISTORY — DX: Other specified pregnancy related conditions, unspecified trimester: O26.899

## 2014-12-20 HISTORY — DX: Heartburn: R12

## 2014-12-20 HISTORY — DX: Anxiety disorder, unspecified: F41.9

## 2014-12-20 LAB — CBC
HCT: 32.8 % — ABNORMAL LOW (ref 36.0–46.0)
HEMOGLOBIN: 10.9 g/dL — AB (ref 12.0–15.0)
MCH: 26.3 pg (ref 26.0–34.0)
MCHC: 33.2 g/dL (ref 30.0–36.0)
MCV: 79.2 fL (ref 78.0–100.0)
PLATELETS: 212 10*3/uL (ref 150–400)
RBC: 4.14 MIL/uL (ref 3.87–5.11)
RDW: 14.6 % (ref 11.5–15.5)
WBC: 8.9 10*3/uL (ref 4.0–10.5)

## 2014-12-20 LAB — URINALYSIS, ROUTINE W REFLEX MICROSCOPIC
Bilirubin Urine: NEGATIVE
GLUCOSE, UA: NEGATIVE mg/dL
Hgb urine dipstick: NEGATIVE
KETONES UR: 15 mg/dL — AB
Leukocytes, UA: NEGATIVE
NITRITE: NEGATIVE
Protein, ur: NEGATIVE mg/dL
UROBILINOGEN UA: 1 mg/dL (ref 0.0–1.0)
pH: 6 (ref 5.0–8.0)

## 2014-12-20 MED ORDER — DEXTROSE 5 % IV SOLN
2.0000 g | INTRAVENOUS | Status: AC
  Administered 2014-12-21: 2 g via INTRAVENOUS
  Filled 2014-12-20: qty 2

## 2014-12-20 NOTE — Anesthesia Preprocedure Evaluation (Signed)
Anesthesia Evaluation  Patient identified by MRN, date of birth, ID band Patient awake    Reviewed: Allergy & Precautions, NPO status , Patient's Chart, lab work & pertinent test results  History of Anesthesia Complications Negative for: history of anesthetic complications  Airway Mallampati: III  TM Distance: >3 FB Neck ROM: Full    Dental no notable dental hx. (+) Dental Advisory Given   Pulmonary former smoker,  breath sounds clear to auscultation  Pulmonary exam normal       Cardiovascular negative cardio ROS Normal cardiovascular examRhythm:Regular Rate:Normal     Neuro/Psych negative neurological ROS  negative psych ROS   GI/Hepatic negative GI ROS, Neg liver ROS,   Endo/Other  Morbid obesity  Renal/GU negative Renal ROS  negative genitourinary   Musculoskeletal negative musculoskeletal ROS (+)   Abdominal (+) + obese,   Peds negative pediatric ROS (+)  Hematology  (+) anemia ,   Anesthesia Other Findings   Reproductive/Obstetrics (+) Pregnancy                             Anesthesia Physical Anesthesia Plan  ASA: III  Anesthesia Plan: Spinal   Post-op Pain Management:    Induction:   Airway Management Planned:   Additional Equipment:   Intra-op Plan:   Post-operative Plan:   Informed Consent: I have reviewed the patients History and Physical, chart, labs and discussed the procedure including the risks, benefits and alternatives for the proposed anesthesia with the patient or authorized representative who has indicated his/her understanding and acceptance.   Dental advisory given  Plan Discussed with: CRNA  Anesthesia Plan Comments:         Anesthesia Quick Evaluation

## 2014-12-20 NOTE — Patient Instructions (Addendum)
   Your procedure is scheduled on:  Friday, May 27  [facility] through [facility] at: 7 AM Pick up the phone at the desk and dial [phone] and inform us of your arrival.  Please call this number if you have any problems the morning of surgery: [phone]  Remember: Do not eat or drink after midnight: Thursday Take these medicines the morning of surgery with a SIP OF WATER:  NONE  Do not wear jewelry, make-up, or FINGER nail polish No metal in your hair or on your body. Do not wear lotions, powders, perfumes.  You may wear deodorant.  Do not bring valuables to the hospital.  Leave suitcase in the car. After Surgery it may be brought to your room. For patients being admitted to the hospital, checkout time is 11:00am the day of discharge.  [facility] [phone]

## 2014-12-21 ENCOUNTER — Encounter (HOSPITAL_COMMUNITY): Payer: Self-pay | Admitting: *Deleted

## 2014-12-21 ENCOUNTER — Inpatient Hospital Stay (HOSPITAL_COMMUNITY)
Admission: RE | Admit: 2014-12-21 | Discharge: 2014-12-23 | DRG: 765 | Disposition: A | Discharge status: Home / Self Care | Payer: Medicaid Other | Source: Ambulatory Visit | Attending: Obstetrics & Gynecology | Admitting: Obstetrics & Gynecology

## 2014-12-21 ENCOUNTER — Inpatient Hospital Stay (HOSPITAL_COMMUNITY): Payer: Medicaid Other | Admitting: Anesthesiology

## 2014-12-21 ENCOUNTER — Encounter (HOSPITAL_COMMUNITY)
Admission: RE | Disposition: A | Discharge status: Home / Self Care | Payer: Self-pay | Source: Ambulatory Visit | Attending: Obstetrics & Gynecology

## 2014-12-21 DIAGNOSIS — O3421 Maternal care for scar from previous cesarean delivery: Principal | ICD-10-CM | POA: Diagnosis present

## 2014-12-21 DIAGNOSIS — O321XX0 Maternal care for breech presentation, not applicable or unspecified: Secondary | ICD-10-CM | POA: Diagnosis present

## 2014-12-21 DIAGNOSIS — O99824 Streptococcus B carrier state complicating childbirth: Secondary | ICD-10-CM | POA: Diagnosis present

## 2014-12-21 DIAGNOSIS — O34593 Maternal care for other abnormalities of gravid uterus, third trimester: Secondary | ICD-10-CM | POA: Diagnosis present

## 2014-12-21 DIAGNOSIS — Z833 Family history of diabetes mellitus: Secondary | ICD-10-CM | POA: Diagnosis not present

## 2014-12-21 DIAGNOSIS — Z8249 Family history of ischemic heart disease and other diseases of the circulatory system: Secondary | ICD-10-CM | POA: Diagnosis not present

## 2014-12-21 DIAGNOSIS — Z3A39 39 weeks gestation of pregnancy: Secondary | ICD-10-CM | POA: Diagnosis not present

## 2014-12-21 DIAGNOSIS — Z98891 History of uterine scar from previous surgery: Secondary | ICD-10-CM

## 2014-12-21 DIAGNOSIS — Q5110 Doubling of uterus with doubling of cervix and vagina without obstruction: Secondary | ICD-10-CM | POA: Diagnosis not present

## 2014-12-21 DIAGNOSIS — Z6841 Body Mass Index (BMI) 40.0 and over, adult: Secondary | ICD-10-CM

## 2014-12-21 DIAGNOSIS — Z87891 Personal history of nicotine dependence: Secondary | ICD-10-CM

## 2014-12-21 DIAGNOSIS — O328XX1 Maternal care for other malpresentation of fetus, fetus 1: Secondary | ICD-10-CM

## 2014-12-21 DIAGNOSIS — O99214 Obesity complicating childbirth: Secondary | ICD-10-CM | POA: Diagnosis present

## 2014-12-21 LAB — PREPARE RBC (CROSSMATCH)

## 2014-12-21 LAB — RPR: RPR Ser Ql: NONREACTIVE

## 2014-12-21 SURGERY — Surgical Case
Anesthesia: Spinal

## 2014-12-21 MED ORDER — FENTANYL CITRATE (PF) 100 MCG/2ML IJ SOLN
INTRAMUSCULAR | Status: AC
  Filled 2014-12-21: qty 2

## 2014-12-21 MED ORDER — PHENYLEPHRINE 8 MG IN D5W 100 ML (0.08MG/ML) PREMIX OPTIME
INJECTION | INTRAVENOUS | Status: DC | PRN
  Administered 2014-12-21: 60 ug/min via INTRAVENOUS

## 2014-12-21 MED ORDER — LANOLIN HYDROUS EX OINT: 1.0000 "application " | TOPICAL_OINTMENT | CUTANEOUS | Status: DC | PRN

## 2014-12-21 MED ORDER — KETOROLAC TROMETHAMINE 30 MG/ML IJ SOLN: 30.0000 mg | Freq: Four times a day (QID) | INTRAMUSCULAR | Status: AC | PRN

## 2014-12-21 MED ORDER — FENTANYL CITRATE (PF) 100 MCG/2ML IJ SOLN: 25.0000 ug | INTRAMUSCULAR | Status: DC | PRN

## 2014-12-21 MED ORDER — MORPHINE SULFATE 0.5 MG/ML IJ SOLN
INTRAMUSCULAR | Status: AC
  Filled 2014-12-21: qty 10

## 2014-12-21 MED ORDER — METHYLERGONOVINE MALEATE 0.2 MG/ML IJ SOLN: 0.2000 mg | INTRAMUSCULAR | Status: DC | PRN

## 2014-12-21 MED ORDER — NALBUPHINE HCL 10 MG/ML IJ SOLN: 5.0000 mg | INTRAMUSCULAR | Status: DC | PRN

## 2014-12-21 MED ORDER — PHENYLEPHRINE 8 MG IN D5W 100 ML (0.08MG/ML) PREMIX OPTIME
INJECTION | INTRAVENOUS | Status: AC
  Filled 2014-12-21: qty 100

## 2014-12-21 MED ORDER — FAMOTIDINE 20 MG PO TABS
20.0000 mg | ORAL_TABLET | Freq: Every day | ORAL | Status: DC
Start: 2014-12-21 — End: 2014-12-23
  Administered 2014-12-22: 20 mg via ORAL
  Filled 2014-12-21: qty 1

## 2014-12-21 MED ORDER — DIPHENHYDRAMINE HCL 25 MG PO CAPS
25.0000 mg | ORAL_CAPSULE | ORAL | Status: DC | PRN
  Administered 2014-12-22: 25 mg via ORAL
  Filled 2014-12-21: qty 1

## 2014-12-21 MED ORDER — WITCH HAZEL-GLYCERIN EX PADS: 1.0000 "application " | MEDICATED_PAD | CUTANEOUS | Status: DC | PRN

## 2014-12-21 MED ORDER — KETOROLAC TROMETHAMINE 30 MG/ML IJ SOLN
30.0000 mg | Freq: Four times a day (QID) | INTRAMUSCULAR | Status: AC | PRN
  Administered 2014-12-21: 30 mg via INTRAVENOUS

## 2014-12-21 MED ORDER — SIMETHICONE 80 MG PO CHEW
80.0000 mg | CHEWABLE_TABLET | ORAL | Status: DC
  Administered 2014-12-22: 80 mg via ORAL
  Filled 2014-12-21 (×2): qty 1

## 2014-12-21 MED ORDER — DIPHENHYDRAMINE HCL 25 MG PO CAPS: 25.0000 mg | ORAL_CAPSULE | Freq: Four times a day (QID) | ORAL | Status: DC | PRN

## 2014-12-21 MED ORDER — OXYTOCIN 10 UNIT/ML IJ SOLN
40.0000 [IU] | INTRAVENOUS | Status: DC | PRN
  Administered 2014-12-21: 40 [IU] via INTRAVENOUS

## 2014-12-21 MED ORDER — ACETAMINOPHEN 325 MG PO TABS: 650.0000 mg | ORAL_TABLET | ORAL | Status: DC | PRN

## 2014-12-21 MED ORDER — DIBUCAINE 1 % RE OINT: 1.0000 "application " | TOPICAL_OINTMENT | RECTAL | Status: DC | PRN

## 2014-12-21 MED ORDER — METHYLERGONOVINE MALEATE 0.2 MG PO TABS: 0.2000 mg | ORAL_TABLET | ORAL | Status: DC | PRN

## 2014-12-21 MED ORDER — KETOROLAC TROMETHAMINE 30 MG/ML IJ SOLN
INTRAMUSCULAR | Status: AC
  Administered 2014-12-21: 30 mg via INTRAVENOUS
  Filled 2014-12-21: qty 1

## 2014-12-21 MED ORDER — TETANUS-DIPHTH-ACELL PERTUSSIS 5-2.5-18.5 LF-MCG/0.5 IM SUSP
0.5000 mL | Freq: Once | INTRAMUSCULAR | Status: AC
  Administered 2014-12-22: 0.5 mL via INTRAMUSCULAR
  Filled 2014-12-21: qty 0.5

## 2014-12-21 MED ORDER — SENNOSIDES-DOCUSATE SODIUM 8.6-50 MG PO TABS
2.0000 | ORAL_TABLET | ORAL | Status: DC
  Administered 2014-12-22 (×2): 2 via ORAL
  Filled 2014-12-21 (×2): qty 2

## 2014-12-21 MED ORDER — OXYTOCIN 40 UNITS IN LACTATED RINGERS INFUSION - SIMPLE MED: 62.5000 mL/h | INTRAVENOUS | Status: AC

## 2014-12-21 MED ORDER — MENTHOL 3 MG MT LOZG: 1.0000 | LOZENGE | OROMUCOSAL | Status: DC | PRN

## 2014-12-21 MED ORDER — SCOPOLAMINE 1 MG/3DAYS TD PT72
1.0000 | MEDICATED_PATCH | Freq: Once | TRANSDERMAL | Status: DC
  Administered 2014-12-21: 1.5 mg via TRANSDERMAL

## 2014-12-21 MED ORDER — BUPIVACAINE HCL (PF) 0.25 % IJ SOLN
INTRAMUSCULAR | Status: AC
  Filled 2014-12-21: qty 30

## 2014-12-21 MED ORDER — PHENYLEPHRINE HCL 10 MG/ML IJ SOLN
INTRAMUSCULAR | Status: DC | PRN
  Administered 2014-12-21: 40 ug via INTRAVENOUS

## 2014-12-21 MED ORDER — NALOXONE HCL 0.4 MG/ML IJ SOLN: 0.4000 mg | INTRAMUSCULAR | Status: DC | PRN

## 2014-12-21 MED ORDER — GLYCOPYRROLATE 0.2 MG/ML IJ SOLN
INTRAMUSCULAR | Status: AC
  Filled 2014-12-21: qty 1

## 2014-12-21 MED ORDER — FENTANYL CITRATE (PF) 100 MCG/2ML IJ SOLN
INTRAMUSCULAR | Status: DC | PRN
  Administered 2014-12-21: 10 ug via INTRATHECAL

## 2014-12-21 MED ORDER — BUPIVACAINE LIPOSOME 1.3 % IJ SUSP
INTRAMUSCULAR | Status: DC | PRN
  Administered 2014-12-21: 50 mL

## 2014-12-21 MED ORDER — NALBUPHINE HCL 10 MG/ML IJ SOLN
5.0000 mg | Freq: Once | INTRAMUSCULAR | Status: AC | PRN
Start: 2014-12-21 — End: 2014-12-21

## 2014-12-21 MED ORDER — BUPIVACAINE LIPOSOME 1.3 % IJ SUSP
20.0000 mL | Freq: Once | INTRAMUSCULAR | Status: DC
  Filled 2014-12-21: qty 20

## 2014-12-21 MED ORDER — ONDANSETRON HCL 4 MG/2ML IJ SOLN: 4.0000 mg | Freq: Once | INTRAMUSCULAR | Status: DC | PRN

## 2014-12-21 MED ORDER — SODIUM CHLORIDE 0.9 % IJ SOLN: 3.0000 mL | INTRAMUSCULAR | Status: DC | PRN

## 2014-12-21 MED ORDER — LACTATED RINGERS IV SOLN
INTRAVENOUS | Status: DC | PRN
  Administered 2014-12-21 (×3): via INTRAVENOUS

## 2014-12-21 MED ORDER — LACTATED RINGERS IV SOLN
INTRAVENOUS | Status: DC
  Administered 2014-12-21 – 2014-12-22 (×2): via INTRAVENOUS

## 2014-12-21 MED ORDER — SIMETHICONE 80 MG PO CHEW: 80.0000 mg | CHEWABLE_TABLET | ORAL | Status: DC | PRN

## 2014-12-21 MED ORDER — ONDANSETRON HCL 4 MG/2ML IJ SOLN
INTRAMUSCULAR | Status: AC
  Filled 2014-12-21: qty 2

## 2014-12-21 MED ORDER — OXYTOCIN 10 UNIT/ML IJ SOLN
INTRAMUSCULAR | Status: AC
  Filled 2014-12-21: qty 4

## 2014-12-21 MED ORDER — MORPHINE SULFATE (PF) 0.5 MG/ML IJ SOLN
INTRAMUSCULAR | Status: DC | PRN
  Administered 2014-12-21: .2 mg via EPIDURAL

## 2014-12-21 MED ORDER — ZOLPIDEM TARTRATE 5 MG PO TABS: 5.0000 mg | ORAL_TABLET | Freq: Every evening | ORAL | Status: DC | PRN

## 2014-12-21 MED ORDER — PRENATAL MULTIVITAMIN CH
1.0000 | ORAL_TABLET | Freq: Every day | ORAL | Status: DC
  Administered 2014-12-22: 1 via ORAL
  Filled 2014-12-21: qty 1

## 2014-12-21 MED ORDER — DIPHENHYDRAMINE HCL 50 MG/ML IJ SOLN: 12.5000 mg | INTRAMUSCULAR | Status: DC | PRN

## 2014-12-21 MED ORDER — ONDANSETRON HCL 4 MG/2ML IJ SOLN
4.0000 mg | Freq: Three times a day (TID) | INTRAMUSCULAR | Status: DC | PRN
Start: 2014-12-21 — End: 2014-12-23

## 2014-12-21 MED ORDER — SCOPOLAMINE 1 MG/3DAYS TD PT72
MEDICATED_PATCH | TRANSDERMAL | Status: AC
  Administered 2014-12-21: 1.5 mg via TRANSDERMAL
  Filled 2014-12-21: qty 1

## 2014-12-21 MED ORDER — PHENYLEPHRINE 40 MCG/ML (10ML) SYRINGE FOR IV PUSH (FOR BLOOD PRESSURE SUPPORT)
PREFILLED_SYRINGE | INTRAVENOUS | Status: AC
  Filled 2014-12-21: qty 10

## 2014-12-21 MED ORDER — IBUPROFEN 600 MG PO TABS
600.0000 mg | ORAL_TABLET | Freq: Four times a day (QID) | ORAL | Status: DC
  Administered 2014-12-21 – 2014-12-23 (×7): 600 mg via ORAL
  Filled 2014-12-21 (×8): qty 1

## 2014-12-21 MED ORDER — MEPERIDINE HCL 25 MG/ML IJ SOLN: 6.2500 mg | INTRAMUSCULAR | Status: DC | PRN

## 2014-12-21 MED ORDER — SIMETHICONE 80 MG PO CHEW
80.0000 mg | CHEWABLE_TABLET | Freq: Three times a day (TID) | ORAL | Status: DC
  Administered 2014-12-21 – 2014-12-22 (×4): 80 mg via ORAL
  Filled 2014-12-21 (×4): qty 1

## 2014-12-21 MED ORDER — ONDANSETRON HCL 4 MG/2ML IJ SOLN
INTRAMUSCULAR | Status: DC | PRN
  Administered 2014-12-21: 4 mg via INTRAVENOUS

## 2014-12-21 MED ORDER — NALOXONE HCL 1 MG/ML IJ SOLN
1.0000 ug/kg/h | INTRAVENOUS | Status: DC | PRN
  Filled 2014-12-21: qty 2

## 2014-12-21 MED ORDER — LACTATED RINGERS IV SOLN
Freq: Once | INTRAVENOUS | Status: AC
  Administered 2014-12-21: 08:00:00 via INTRAVENOUS

## 2014-12-21 MED ORDER — OXYCODONE-ACETAMINOPHEN 5-325 MG PO TABS: 2.0000 | ORAL_TABLET | ORAL | Status: DC | PRN

## 2014-12-21 MED ORDER — BUPIVACAINE IN DEXTROSE 0.75-8.25 % IT SOLN
INTRATHECAL | Status: DC | PRN
  Administered 2014-12-21: 1.6 mg via INTRATHECAL

## 2014-12-21 MED ORDER — OXYCODONE-ACETAMINOPHEN 5-325 MG PO TABS
1.0000 | ORAL_TABLET | ORAL | Status: DC | PRN
  Administered 2014-12-22 – 2014-12-23 (×2): 1 via ORAL
  Filled 2014-12-21 (×2): qty 1

## 2014-12-21 MED ORDER — SCOPOLAMINE 1 MG/3DAYS TD PT72
1.0000 | MEDICATED_PATCH | Freq: Once | TRANSDERMAL | Status: DC
  Filled 2014-12-21: qty 1

## 2014-12-21 SURGICAL SUPPLY — 37 items
CLAMP CORD UMBIL (MISCELLANEOUS) IMPLANT
CLOTH BEACON ORANGE TIMEOUT ST (SAFETY) ×3 IMPLANT
DRAPE SHEET LG 3/4 BI-LAMINATE (DRAPES) IMPLANT
DRSG OPSITE 11X17.75 LRG (GAUZE/BANDAGES/DRESSINGS) ×6 IMPLANT
DRSG OPSITE POSTOP 4X10 (GAUZE/BANDAGES/DRESSINGS) ×3 IMPLANT
DURAPREP 26ML APPLICATOR (WOUND CARE) ×6 IMPLANT
ELECT REM PT RETURN 9FT ADLT (ELECTROSURGICAL) ×3
ELECTRODE REM PT RTRN 9FT ADLT (ELECTROSURGICAL) ×1 IMPLANT
EXTRACTOR VACUUM BELL STYLE (SUCTIONS) IMPLANT
GLOVE BIOGEL PI IND STRL 8 (GLOVE) ×1 IMPLANT
GLOVE BIOGEL PI INDICATOR 8 (GLOVE) ×2
GLOVE ECLIPSE 8.0 STRL XLNG CF (GLOVE) ×3 IMPLANT
GOWN STRL REUS W/TWL LRG LVL3 (GOWN DISPOSABLE) ×6 IMPLANT
KIT ABG SYR 3ML LUER SLIP (SYRINGE) ×3 IMPLANT
LIQUID BAND (GAUZE/BANDAGES/DRESSINGS) IMPLANT
NEEDLE HYPO 18GX1.5 BLUNT FILL (NEEDLE) ×3 IMPLANT
NEEDLE HYPO 22GX1.5 SAFETY (NEEDLE) ×3 IMPLANT
NEEDLE HYPO 25X5/8 SAFETYGLIDE (NEEDLE) ×3 IMPLANT
NS IRRIG 1000ML POUR BTL (IV SOLUTION) ×3 IMPLANT
PACK C SECTION WH (CUSTOM PROCEDURE TRAY) ×3 IMPLANT
PAD ABD 8X7 1/2 STERILE (GAUZE/BANDAGES/DRESSINGS) ×3 IMPLANT
PAD OB MATERNITY 4.3X12.25 (PERSONAL CARE ITEMS) ×3 IMPLANT
RTRCTR C-SECT PINK 25CM LRG (MISCELLANEOUS) ×3 IMPLANT
SPONGE GAUZE 4X4 12PLY STER LF (GAUZE/BANDAGES/DRESSINGS) ×3 IMPLANT
STAPLER VISISTAT 35W (STAPLE) ×3 IMPLANT
SUT CHROMIC 0 CT 1 (SUTURE) ×3 IMPLANT
SUT MNCRL 0 VIOLET CTX 36 (SUTURE) ×3 IMPLANT
SUT MONOCRYL 0 CTX 36 (SUTURE) ×6
SUT PLAIN 2 0 (SUTURE)
SUT PLAIN 2 0 XLH (SUTURE) IMPLANT
SUT PLAIN ABS 2-0 CT1 27XMFL (SUTURE) IMPLANT
SUT VIC AB 0 CTX 36 (SUTURE) ×2
SUT VIC AB 0 CTX36XBRD ANBCTRL (SUTURE) ×1 IMPLANT
SUT VIC AB 4-0 KS 27 (SUTURE) IMPLANT
SYR 20CC LL (SYRINGE) ×6 IMPLANT
TOWEL OR 17X24 6PK STRL BLUE (TOWEL DISPOSABLE) ×3 IMPLANT
TRAY FOLEY CATH SILVER 14FR (SET/KITS/TRAYS/PACK) IMPLANT

## 2014-12-21 NOTE — Anesthesia Postprocedure Evaluation (Signed)
  Anesthesia Post-op Note  Patient: Amanda Sanchez  Procedure(s) Performed: Procedure(s) (LRB): REPEAT CESAREAN SECTION (N/A)  Patient Location: PACU  Anesthesia Type: spinal  Level of Consciousness: awake and alert   Airway and Oxygen Therapy: Patient Spontanous Breathing  Post-op Pain: mild  Post-op Assessment: Post-op Vital signs reviewed, Patient's Cardiovascular Status Stable, Respiratory Function Stable, Patent Airway and No signs of Nausea or vomiting  Last Vitals:  Filed Vitals:   12/21/14 1400  BP: 116/63  Pulse: 91  Temp: 37.2 C  Resp: 18    Post-op Vital Signs: stable   Complications: No apparent anesthesia complications

## 2014-12-21 NOTE — Anesthesia Postprocedure Evaluation (Signed)
  Anesthesia Post-op Note  Patient: Amanda ParisNatasha L Daubenspeck  Procedure(s) Performed: Procedure(s): REPEAT CESAREAN SECTION (N/A)  Patient Location: Mother/Baby  Anesthesia Type:Spinal  Level of Consciousness: awake  Airway and Oxygen Therapy: Patient Spontanous Breathing  Post-op Pain: mild  Post-op Assessment: Patient's Cardiovascular Status Stable and Respiratory Function Stable  Post-op Vital Signs: stable  Last Vitals:  Filed Vitals:   12/21/14 1400  BP: 116/63  Pulse: 91  Temp: 37.2 C  Resp: 18    Complications: No apparent anesthesia complications

## 2014-12-21 NOTE — Anesthesia Procedure Notes (Signed)
Spinal Patient location during procedure: OR Start time: 12/21/2014 8:30 AM Staffing Anesthesiologist: Karie SchwalbeJUDD, Deshaun Schou Performed by: anesthesiologist  Preanesthetic Checklist Completed: patient identified, site marked, surgical consent, pre-op evaluation, timeout performed, IV checked, risks and benefits discussed and monitors and equipment checked Spinal Block Patient position: sitting Prep: DuraPrep Patient monitoring: continuous pulse ox, blood pressure and heart rate Approach: midline Location: L3-4 Injection technique: single-shot Needle Needle type: Sprotte  Needle gauge: 24 G Needle length: 9 cm Assessment Sensory level: T4 Additional Notes Functioning IV was confirmed and monitors were applied. Sterile prep and drape, including hand hygiene, mask and sterile gloves were used. The patient was positioned and the spine was prepped. The skin was anesthetized with lidocaine.  Free flow of clear CSF was obtained prior to injecting local anesthetic into the CSF.  The spinal needle aspirated freely following injection.  The needle was carefully withdrawn.  The patient tolerated the procedure well. Consent was obtained prior to procedure with all questions answered and concerns addressed. Risks including but not limited to bleeding, infection, nerve damage, paralysis, failed block, inadequate analgesia, allergic reaction, high spinal, itching and headache were discussed and the patient wished to proceed.   Karie SchwalbeMary Tia Hieronymus, MD

## 2014-12-21 NOTE — Anesthesia Postprocedure Evaluation (Signed)
  Anesthesia Post-op Note  Patient: Amanda Sanchez  Procedure(s) Performed: Procedure(s) (LRB): REPEAT CESAREAN SECTION (N/A)  Patient Location: PACU  Anesthesia Type: Spinal  Level of Consciousness: awake and alert   Airway and Oxygen Therapy: Patient Spontanous Breathing  Post-op Pain: mild  Post-op Assessment: Post-op Vital signs reviewed, Patient's Cardiovascular Status Stable, Respiratory Function Stable, Patent Airway and No signs of Nausea or vomiting  Last Vitals:  Filed Vitals:   12/21/14 1115  BP: 118/71  Pulse: 78  Temp: 36.6 C  Resp: 19    Post-op Vital Signs: stable   Complications: No apparent anesthesia complications

## 2014-12-21 NOTE — H&P (Signed)
Preoperative History and Physical  Amanda Sanchez is a 34 y.o. G3P1011 with Patient's last menstrual period was 03/08/2014. Estimated Date of Delivery: 12/28/14  [redacted]w[redacted]d admitted for a repeat Caesarean section.  Breech presentation in a patient with uterine didelphys GBS positive, A negative Pregnancy is otherwise negative  PMH:    Past Medical History  Diagnosis Date  . Hyperlipidemia     diet controlled, no med  . Anemia   . Obesity   . Vaginal discharge during pregnancy in first trimester 05/02/2014  . Yeast infection 05/02/2014  . Complete bicornuate uterus     2 cervices  . Uterus didelphys 2 cervices  . Anxiety     no meds  . Heartburn in pregnancy     zantac prn    PSH:     Past Surgical History  Procedure Laterality Date  . Cesarean section  06/2009    [facility]  . Wisdom tooth extraction      POb/GynH:      OB History    Gravida Para Term Preterm AB TAB SAB Ectopic Multiple Living   SH:   History  Substance Use Topics  . Smoking status: Former Smoker -- 0.10 packs/day for 15 years    Types: Cigarettes    Quit date: 04/26/2014  . Smokeless tobacco: Never Used  . Alcohol Use: Yes     Comment: social but none with pregnancy    FH:    Family History  Problem Relation Age of Onset  . Diabetes Maternal Grandfather   . Hypertension Maternal Grandfather   . Diabetes Paternal Grandmother   . Hypertension Paternal Grandmother   . Diabetes Paternal Grandfather   . Hypertension Paternal Grandfather   . Fibroids Mother   . Hyperlipidemia Father      Allergies: No Known Allergies  Medications:       Current facility-administered medications:  .  bupivacaine liposome (EXPAREL) 1.3 % injection 266 mg, 20 mL, Infiltration, Once, Lazaro Arms, MD .  cefoTEtan (CEFOTAN) 2 g in dextrose 5 % 50 mL IVPB, 2 g, Intravenous, On Call to OR, Lazaro Arms, MD .  scopolamine (TRANSDERM-SCOP) 1 MG/3DAYS 1.5 mg, 1 patch, Transdermal, Once, Mal Amabile, MD, 1.5 mg at 12/21/14 1610  Review of Systems:   Review of Systems  Constitutional: Negative for fever, chills, weight loss, malaise/fatigue and diaphoresis.  HENT: Negative for hearing loss, ear pain, nosebleeds, congestion, sore throat, neck pain, tinnitus and ear discharge.   Eyes: Negative for blurred vision, double vision, photophobia, pain, discharge and redness.  Respiratory: Negative for cough, hemoptysis, sputum production, shortness of breath, wheezing and stridor.   Cardiovascular: Negative for chest pain, palpitations, orthopnea, claudication, leg swelling and PND.  Gastrointestinal: Positive for abdominal pain. Negative for heartburn, nausea, vomiting, diarrhea, constipation, blood in stool and melena.  Genitourinary: Negative for dysuria, urgency, frequency, hematuria and flank pain.  Musculoskeletal: Negative for myalgias, back pain, joint pain and falls.  Skin: Negative for itching and rash.  Neurological: Negative for dizziness, tingling, tremors, sensory change, speech change, focal weakness, seizures, loss of consciousness, weakness and headaches.  Endo/Heme/Allergies: Negative for environmental allergies and polydipsia. Does not bruise/bleed easily.  Psychiatric/Behavioral: Negative for depression, suicidal ideas, hallucinations, memory loss and substance abuse. The patient is not nervous/anxious and does not have insomnia.      PHYSICAL EXAM:  Blood pressure 139/85, pulse 87, temperature 98.1 F (36.7  C), temperature source Oral, resp. rate 20, last menstrual period 03/08/2014, SpO2 98 %.    Vitals reviewed. Constitutional: She is oriented to person, place, and time. She appears well-developed and well-nourished.  HENT:  Head: Normocephalic and atraumatic.  Right Ear: External ear normal.  Left Ear: External ear normal.  Nose: Nose normal.  Mouth/Throat: Oropharynx is clear and moist.  Eyes: Conjunctivae and EOM are normal. Pupils are equal, round, and  reactive to light. Right eye exhibits no discharge. Left eye exhibits no discharge. No scleral icterus.  Neck: Normal range of motion. Neck supple. No tracheal deviation present. No thyromegaly present.  Cardiovascular: Normal rate, regular rhythm, normal heart sounds and intact distal pulses.  Exam reveals no gallop and no friction rub.   No murmur heard. Respiratory: Effort normal and breath sounds normal. No respiratory distress. She has no wheezes. She has no rales. She exhibits no tenderness.  GI: Soft. Bowel sounds are normal. She exhibits no distension and no mass. There is tenderness. There is no rebound and no guarding.  Genitourinary:       Vulva is normal without lesions Vagina is pink moist without discharge Cervix normal Uterus is gravid FH 42 cm Adnexa is negative with normal sized ovaries by sonogram  Musculoskeletal: Normal range of motion. She exhibits no edema and no tenderness.  Neurological: She is alert and oriented to person, place, and time. She has normal reflexes. She displays normal reflexes. No cranial nerve deficit. She exhibits normal muscle tone. Coordination normal.  Skin: Skin is warm and dry. No rash noted. No erythema. No pallor.  Psychiatric: She has a normal mood and affect. Her behavior is normal. Judgment and thought content normal.    Labs: Results for orders placed or performed during the hospital encounter of 12/21/14 (from the past 336 hour(s))  Prepare RBC (crossmatch)   Collection Time: 12/21/14  7:10 AM  Result Value Ref Range   Order Confirmation ORDER PROCESSED BY BLOOD BANK   Results for orders placed or performed during the hospital encounter of 12/20/14 (from the past 336 hour(s))  CBC   Collection Time: 12/20/14 10:40 AM  Result Value Ref Range   WBC 8.9 4.0 - 10.5 K/uL   RBC 4.14 3.87 - 5.11 MIL/uL   Hemoglobin 10.9 (L) 12.0 - 15.0 g/dL   HCT 32.432.8 (L) 40.136.0 - 02.746.0 %   MCV 79.2 78.0 - 100.0 fL   MCH 26.3 26.0 - 34.0 pg   MCHC 33.2  30.0 - 36.0 g/dL   RDW 25.314.6 66.411.5 - 40.315.5 %   Platelets 212 150 - 400 K/uL  Urinalysis, Routine w reflex microscopic   Collection Time: 12/20/14 10:40 AM  Result Value Ref Range   Color, Urine YELLOW YELLOW   APPearance CLEAR CLEAR   Specific Gravity, Urine >1.030 (H) 1.005 - 1.030   pH 6.0 5.0 - 8.0   Glucose, UA NEGATIVE NEGATIVE mg/dL   Hgb urine dipstick NEGATIVE NEGATIVE   Bilirubin Urine NEGATIVE NEGATIVE   Ketones, ur 15 (A) NEGATIVE mg/dL   Protein, ur NEGATIVE NEGATIVE mg/dL   Urobilinogen, UA 1.0 0.0 - 1.0 mg/dL   Nitrite NEGATIVE NEGATIVE   Leukocytes, UA NEGATIVE NEGATIVE  RPR   Collection Time: 12/20/14 10:40 AM  Result Value Ref Range   RPR Ser Ql Non Reactive Non Reactive  Type and screen   Collection Time: 12/20/14 10:40 AM  Result Value Ref Range   ABO/RH(D) A NEG    Antibody Screen POS  Sample Expiration 12/23/2014    Antibody Identification PASSIVELY ACQUIRED ANTI-D    DAT, IgG NEG    Unit Number [id number]    Blood Component Type RED CELLS,LR    Unit division 00    Status of Unit ALLOCATED    Transfusion Status OK TO TRANSFUSE    Crossmatch Result COMPATIBLE    Unit Number [id number]    Blood Component Type RED CELLS,LR    Unit division 00    Status of Unit ALLOCATED    Transfusion Status OK TO TRANSFUSE    Crossmatch Result COMPATIBLE     EKG: No orders found for this or any previous visit.  Imaging Studies: No results found.    Assessment: [redacted]w[redacted]d Estimated Date of Delivery: 12/28/14  Previous Caesarean section Breech presentation with uterine didelphys     Patient Active Problem List   Diagnosis Date Noted  . Pica 11/20/2014  . Rh negative state in antepartum period 06/13/2014  . History of cesarean delivery, currently pregnant 05/16/2014  . Supervision of normal pregnancy 05/16/2014  . Uterus didelphys   . Yeast infection 05/02/2014  . Candidal intertrigo 10/27/2011  . Morbid obesity 10/20/2011  . Hyperlipidemia  10/20/2011  . Anemia 10/20/2011  . Tobacco user 10/20/2011    Plan: Repeat Caesarean section  Amanda Sanchez H 12/21/2014 8:07 AM

## 2014-12-21 NOTE — Transfer of Care (Signed)
Immediate Anesthesia Transfer of Care Note  Patient: Conchita ParisNatasha L Sattar  Procedure(s) Performed: Procedure(s): REPEAT CESAREAN SECTION (N/A)  Patient Location: PACU  Anesthesia Type:Spinal  Level of Consciousness: awake  Airway & Oxygen Therapy: Patient Spontanous Breathing  Post-op Assessment: Report given to RN  Post vital signs: Reviewed and stable  Last Vitals:  Filed Vitals:   12/21/14 0715  BP: 139/85  Pulse: 87  Temp: 36.7 C  Resp: 20    Complications: No apparent anesthesia complications

## 2014-12-21 NOTE — Lactation Note (Signed)
This note was copied from the chart of Amanda Esmeralda ArthurNatasha Hawkes. Lactation Consultation Note  P2, Mother states she only attempted bf for a few days and then gave baby formula and states she had a low milk suppy. Also stated" if this becomes too difficult I am going to give him formula." Mother has large nipples and requires some compression "teacup hold" to sustain latch. Mother is able to express drops of colostrum and has given him some on a spoon. Mother tends to pull breast away form baby's nose to allow him to breathe. Was able to latch baby for a few minutes in football hold. Provided mother with a hand pump and larger size #30 flanges. Mom made aware of O/P services, breastfeeding support groups, community resources, and our phone # for post-discharge questions.  Reviewed cluster feeding.  Patient Name: Amanda Sanchez JXBJY'NToday's Date: 12/21/2014 Reason for consult: Initial assessment   Maternal Data Has patient been taught Hand Expression?: Yes Does the patient have breastfeeding experience prior to this delivery?: Yes  Feeding Feeding Type: Breast Fed Length of feed: 12 min  LATCH Score/Interventions Latch: Repeated attempts needed to sustain latch, nipple held in mouth throughout feeding, stimulation needed to elicit sucking reflex. Intervention(s): Adjust position;Assist with latch;Breast massage;Breast compression  Audible Swallowing: None Intervention(s): Skin to skin  Type of Nipple: Everted at rest and after stimulation  Comfort (Breast/Nipple): Soft / non-tender     Hold (Positioning): Assistance needed to correctly position infant at breast and maintain latch.  LATCH Score: 6  Lactation Tools Discussed/Used     Consult Status Consult Status: Follow-up Date: 12/22/14 Follow-up type: In-patient    Dahlia ByesBerkelhammer, [facility] 12/21/2014, 2:17 PM

## 2014-12-21 NOTE — Op Note (Signed)
Preoperative diagnosis:  1.  Intrauterine pregnancy at 8475w0d weeks gestation                                         2.  Breech presentation                                         3.  Previous Caesarean section                                         4.  Uterine Didelphys(double system)   Postoperative diagnosis:  Same as above   Procedure:  Repeat cesarean section  Surgeon:  Lazaro ArmsLuther H Kalyiah Saintil MD  Assistant:    Anesthesia: Spinal  Findings:  .    Over a low transverse incision was delivered a viable female with Apgars of 8 and 9 weighing 6 lbs. 14 oz. The infant was incomplete breech.  Uterus was didelphys, pregnancy in the right horn and the left uterus was small but normal.  The, tubes and ovaries were all normal.  There were no other significant findings  Description of operation:  Patient was taken to the operating room and placed in the sitting position where she underwent a spinal anesthetic. She was then placed in the supine position with tilt to the left side. When adequate anesthetic level was obtained she was prepped and draped in usual sterile fashion and a Foley catheter was placed. A Pfannenstiel skin incision was made and carried down sharply to the rectus fascia which was scored in the midline extended laterally. The fascia was taken off the muscles both superiorly and without difficulty. The muscles were divided.  The peritoneal cavity was entered.  Bladder blade was placed, no bladder flap was created.  A low transverse hysterotomy incision was made and delivered a viable female  infant at 0900 with Apgars of 8 and 9 weighing6 lbs 14 oz.  Cord pH was obtained and was 7.25.  The infant was in the incomplete breech presentation.   The uterus was exteriorized. It was closed in 2 layers, the first being a running interlocking layer and the second being an imbricating layer using 0 monocryl on a CTX needle. There was good resulting hemostasis. The uterus was didelphys as was previously  known.  The  tubes and ovaries were all normal. Peritoneal cavity was irrigated vigorously. The muscles and peritoneum were reapproximated loosely. The fascia was closed using 0 Vicryl in running fashion. Subcutaneous tissue was made hemostatic and irrigated. The skin was closed using 4-0 Vicryl on a Keith needle in a subcuticular fashion.  Dermabond was placed for additional wound integrity and to serve as a barrier. Blood loss for the procedure was 500 cc. The patient received 2 gram of Ancef prophylactically. The patient was taken to the recovery room in good stable condition with all counts being correct x3.  EBL 500 cc  Akeen Ledyard H 12/21/2014 10:03 AM

## 2014-12-22 LAB — CBC
HCT: 27.7 % — ABNORMAL LOW (ref 36.0–46.0)
Hemoglobin: 9.2 g/dL — ABNORMAL LOW (ref 12.0–15.0)
MCH: 26.4 pg (ref 26.0–34.0)
MCHC: 33.2 g/dL (ref 30.0–36.0)
MCV: 79.4 fL (ref 78.0–100.0)
Platelets: 185 K/uL (ref 150–400)
RBC: 3.49 MIL/uL — ABNORMAL LOW (ref 3.87–5.11)
RDW: 14.9 % (ref 11.5–15.5)
WBC: 9.3 K/uL (ref 4.0–10.5)

## 2014-12-22 NOTE — Progress Notes (Signed)
Post Op Day #1  Subjective:  Amanda Sanchez is a 34 y.o. [id number] 6859w0d s/p LTCS.  No acute events overnight.  Pt denies problems with ambulating, voiding or po intake.  She denies nausea or vomiting.  Pain is well controlled.  She has had flatus. She has not had bowel movement.  Lochia Moderate.  Plan for birth control is IUD.  Method of Feeding: Tried breast feeding but going more towards bottle.   No concerns voiced.   Objective: BP 111/76 mmHg  Pulse 85  Temp(Src) 98.6 F (37 C) (Axillary)  Resp 18  SpO2 97%  LMP 03/08/2014  Breastfeeding? Unknown  Physical Exam:  General: alert, cooperative and no distress Lochia:normal flow Chest: CTAB Heart: RRR no m/r/g Abdomen: +BS, soft, nontender, fundus firm, incision clean and dry DVT Evaluation: No evidence of DVT seen on physical exam. Extremities: no edema   Recent Labs  12/20/14 1040 12/22/14 0540  HGB 10.9* 9.2*  HCT 32.8* 27.7*    Assessment/Plan:  ASSESSMENT: Amanda Sanchez is a 34 y.o. G3P2012 1459w0d pod #1 s/p LTCS doing well.   Plan for discharge tomorrow and Breastfeeding  Continue post-partum care.    LOS: 1 day   Caryl AdaJazma Phelps, DO 12/22/2014, 11:15 AM PGY-1, [facility]

## 2014-12-22 NOTE — Discharge Instructions (Signed)

## 2014-12-22 NOTE — Discharge Summary (Signed)
  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 [id number] s/p rLTCS.  Patient was admitted for repeat cesarean section.  She has postpartum course that was uncomplicated including no problems with ambulating, PO intake, urination, pain, or bleeding. The pt feels ready to go home and  will be discharged with outpatient follow-up.   Today: No acute events overnight.  Pt denies problems with ambulating, voiding or po intake.  She denies nausea or vomiting.  Pain is well controlled.  Method of Feeding: breast.  Anti-D antibody positive, no rhogam to be given prior to discharge  Physical Exam:  General: alert and cooperative Lochia: appropriate Uterine Fundus: firm Incision: dressed DVT Evaluation: No evidence of DVT seen on physical exam.  H/H: Lab Results  Component Value Date/Time   HGB 9.2* 12/22/2014 05:40 AM   HCT 27.7* 12/22/2014 05:40 AM    Discharge Diagnoses: Term Pregnancy-delivered  Discharge Information: Date: 12/23/2014 Activity: pelvic rest Diet: routine  Medications: Percocet Breast feeding:  Yes Condition: stable Instructions: refer to handout Discharge to: home       Discharge Instructions    Call MD for:  redness, tenderness, or signs of infection (pain, swelling, redness, odor or green/yellow discharge around incision site)    Complete by:  As directed      Call MD for:  severe uncontrolled pain    Complete by:  As directed      Call MD for:  temperature >100.4    Complete by:  As directed      Diet - low sodium heart healthy    Complete by:  As directed             Medication List    TAKE these medications        acetaminophen 325  MG tablet  Commonly known as:  TYLENOL  Take 650 mg by mouth every 6 (six) hours as needed for mild pain or headache.     fluconazole 150 MG tablet  Commonly known as:  DIFLUCAN  Take 1 tablet (150 mg total) by mouth once. Take the 2nd tablet in 3 days if needed     oxyCODONE-acetaminophen 5-325 MG per tablet  Commonly known as:  PERCOCET/ROXICET  Take 2 tablets by mouth every 4 (four) hours as needed (for pain scale greater than 7).      ASK your doctor about these medications        PRENATAL VITAMIN PO  Take 1 tablet by mouth daily.     ranitidine 150 MG tablet  Commonly known as:  ZANTAC  Take 150 mg by mouth daily as needed for heartburn.       Follow-up Information    [facility]   Specialty:  Obstetrics and Gynecology   Contact information:   [address] [place] [id number] [phone]      Perry MountACOSTA,[place] Mullett ROCIO ,MD OB Fellow 12/23/2014,7:44 AM

## 2014-12-23 MED ORDER — OXYCODONE-ACETAMINOPHEN 5-325 MG PO TABS: 2.0000 | ORAL_TABLET | ORAL | Status: DC | PRN

## 2014-12-23 NOTE — Lactation Note (Signed)
This note was copied from the chart of Amanda Esmeralda ArthurNatasha Distel. Lactation Consultation Note  Patient Name: Amanda Esmeralda Arthuratasha Cu ONGEX'BToday's Date: 12/23/2014 Reason for consult: Follow-up assessment Visited with Mom on day of discharge, baby 1550 hrs old.  Baby has been receiving formula by bottle 10-15 ml, except for one breast feeding in last 24 hrs.  Mom states she had no milk with her first child (33 yrs old), so stopped breast feeding in the hospital.  She wants to try harder with this baby.  She states she has used the manual breast pump a couple times, but didn't express anything.  Demonstrated manual breast compression, and colostrum easily expressed in drops.  Offered to assist with latching, baby did just have 12 ml formula 3 hrs prior, but waking in his crib.  Undressed him and set Mom up with a football hold.  Baby noted to have a tight frenulum, that may be causing some difficulty.  With guidance, baby was able to latch onto areola, and suck a few times, but quickly fell asleep.  Repeatedly latched baby onto breast, instructing Mom how to sandwich breast, and support baby's head well.  Mom tends to just let baby self latch.  Explained importance of sandwiching breast when she latches baby.  [place]  Talked about our pump loaner program, and importance of regular pumping if baby doesn't latch to the breast. Reminded Mom of OP lactation services available.  Encouraged her to call prn.  Encouraged her to keep baby skin to skin and feed on cue.     Consult Status Consult Status: Complete Date: 12/23/14 Follow-up type: In-patient    Judee ClaraSmith, Shamyah Stantz E 12/23/2014, 11:55 AM

## 2014-12-23 NOTE — Clinical Social Work Maternal (Signed)
  CLINICAL SOCIAL WORK MATERNAL/CHILD NOTE  Patient Details  Name: Amanda Sanchez MRN: [id number] Date of Birth: [birth year]  Date:  12/23/2014  Clinical Social Worker Initiating Note:  Amanda Duel, LCSW Date/ Time Initiated:  12/23/14/0930     Child's Name:  Amanda Sanchez   Legal Guardian:   (Parents Aki and [facility])   Need for Interpreter:  None   Date of Referral:  12/21/14     Reason for Referral:  Other (Comment)   Referral Source:  [facility]   Address:  [address]  [place],  [address]  Phone number:   [phone])   Household Members:  Minor Children, Spouse   Natural Supports (not living in the home):  Spouse/significant other, Immediate Family, Extended Family   Professional Supports: None   Employment:  (Both parents employed)   Type of Work:     Education:      Pensions consultant:  [employer]   Other Resources:  [employer]   Cultural/Religious Considerations Which May Impact Care:  none noted  Strengths:  Ability to meet basic needs , Home prepared for child    Risk Factors/Current Problems:  None   Cognitive State:  Alert , Able to Concentrate    Mood/Affect:  Happy , Relaxed , Bright , Interested    CSW Assessment:  Acknowledged order for social work consult to assess mother's hx of anxiety.   Met with mother who was pleasant and receptive to social work.   Parents  are married and have one other dependent age 33.  Mother reports hx of anxiety that she notes was never formally diagnosed or treated.  Informed that she has been able to manage the anxiety using breathing and other relaxation techniques.  She reports feeling overly anxious in the delivery room because she could not feel her legs.  Informed that she would only have panic attacks when things become overwhelming which is not too often.     She denies any current symptoms of depression or anxiety.   She reports no hx of illicit drug use.   No acute social concerns noted or  reported at this time.  Mother informed of social work Fish farm manager.  CSW Plan/Description:     Provided information and resources on PP Depression No further intervention required No barriers to discharge  Amanda Sanchez J, LCSW 12/23/2014, 3:08 PM

## 2014-12-24 ENCOUNTER — Encounter (HOSPITAL_COMMUNITY): Payer: Self-pay | Admitting: Obstetrics & Gynecology

## 2014-12-24 LAB — TYPE AND SCREEN
ABO/RH(D): A NEG
Antibody Screen: POSITIVE
DAT, IGG: NEGATIVE
Unit division: 0
Unit division: 0

## 2014-12-28 ENCOUNTER — Encounter: Payer: Self-pay | Admitting: Obstetrics & Gynecology

## 2014-12-28 ENCOUNTER — Ambulatory Visit (INDEPENDENT_AMBULATORY_CARE_PROVIDER_SITE_OTHER): Payer: Medicaid Other | Admitting: Obstetrics & Gynecology

## 2014-12-28 VITALS — BP 110/80 | HR 72 | Wt 290.0 lb

## 2014-12-28 DIAGNOSIS — Z9889 Other specified postprocedural states: Secondary | ICD-10-CM

## 2014-12-28 NOTE — Progress Notes (Signed)
Patient ID: Amanda Sanchez, female   DOB: 03/09/1981, 34 y.o.   MRN: [id number]   HPI: Patient returns for routine postoperative follow-up having undergone repeat Caesarean section on 5/27.  The patient's immediate postoperative recovery has been unremarkable. Since hospital discharge the patient reports no problems.   Current Outpatient Prescriptions: oxyCODONE-acetaminophen (PERCOCET/ROXICET) 5-325 MG per tablet, Take 2 tablets by mouth every 4 (four) hours as needed (for pain scale greater than 7)., Disp: 30 tablet, Rfl: 0 acetaminophen (TYLENOL) 325 MG tablet, Take 650 mg by mouth every 6 (six) hours as needed for mild pain or headache. , Disp: , Rfl:  fluconazole (DIFLUCAN) 150 MG tablet, Take 1 tablet (150 mg total) by mouth once. Take the 2nd tablet in 3 days if needed (Patient not taking: Reported on 09/05/2014), Disp: 2 tablet, Rfl: 0 Prenatal Vit-Fe Fumarate-FA (PRENATAL VITAMIN PO), Take 1 tablet by mouth daily. , Disp: , Rfl:  ranitidine (ZANTAC) 150 MG tablet, Take 150 mg by mouth daily as needed for heartburn. , Disp: , Rfl:   No current facility-administered medications for this visit.    Blood pressure 110/80, pulse 72, weight 290 lb (131.543 kg), last menstrual period 03/08/2014, unknown if currently breastfeeding.  Physical Exam: incision clean dry intact  Diagnostic Tests:   Pathology:   Impression: Normal post op eval  Plan: GV again next week  Follow up: 1  weeks  Lazaro ArmsEURE,LUTHER H, MD

## 2015-01-04 ENCOUNTER — Ambulatory Visit (INDEPENDENT_AMBULATORY_CARE_PROVIDER_SITE_OTHER): Payer: Medicaid Other | Admitting: Obstetrics & Gynecology

## 2015-01-04 ENCOUNTER — Encounter: Payer: Self-pay | Admitting: Obstetrics & Gynecology

## 2015-01-04 VITALS — BP 124/80 | HR 92 | Ht 64.0 in | Wt 279.5 lb

## 2015-01-04 DIAGNOSIS — Z9889 Other specified postprocedural states: Secondary | ICD-10-CM

## 2015-01-04 DIAGNOSIS — B372 Candidiasis of skin and nail: Secondary | ICD-10-CM

## 2015-01-04 MED ORDER — NORETHINDRONE 0.35 MG PO TABS: 1.0000 | ORAL_TABLET | Freq: Every day | ORAL | Status: DC

## 2015-01-04 NOTE — Progress Notes (Signed)
Patient ID: Amanda Sanchez, female   DOB: 1980/10/29, 34 y.o.   MRN: [id number]   Gentian violet placed on incision last week repplied today + under breast and on left shoulder  shoule be ok now  Follow up 4 weeks  Micronor OCP

## 2015-02-05 ENCOUNTER — Ambulatory Visit (INDEPENDENT_AMBULATORY_CARE_PROVIDER_SITE_OTHER): Payer: Medicaid Other | Admitting: Advanced Practice Midwife

## 2015-02-05 ENCOUNTER — Encounter: Payer: Self-pay | Admitting: Advanced Practice Midwife

## 2015-02-05 NOTE — Progress Notes (Signed)
  Amanda Sanchez is a 34 y.o. who presents for a postpartum visit. She is 6 weeks postpartum following a low cervical transverse Cesarean section. I have fully reviewed the prenatal and intrapartum course. The delivery was at 39  gestational weeks, repeat for didephys uterus  Anesthesia: spinal. Postpartum course has been uneventful except for some yeast around incision, tx'd with gentian violet.. Baby's course has been uneventful. Baby is feeding by bottle. Bleeding: no bleeding. Bowel function is normal. Bladder function is normal. Patient is sexually active. Contraception method is oral progesterone-only contraceptive. Postpartum depression screening: negative.   Current outpatient prescriptions:  .  norethindrone (MICRONOR,CAMILA,ERRIN) 0.35 MG tablet, Take 1 tablet (0.35 mg total) by mouth daily. Take 1 a day, Disp: 1 Package, Rfl: 11  Review of Systems   Constitutional: Negative for fever and chills Eyes: Negative for visual disturbances Respiratory: Negative for shortness of breath, dyspnea Cardiovascular: Negative for chest pain or palpitations  Gastrointestinal: Negative for vomiting, diarrhea and constipation Genitourinary: Negative for dysuria and urgency Musculoskeletal: Negative for back pain, joint pain, myalgias  Neurological: Negative for dizziness and headaches   Objective:     Filed Vitals:   02/05/15 1332  BP: 124/82   General:  alert, cooperative and no distress   Breasts:  negative  Lungs: clear to auscultation bilaterally  Heart:  regular rate and rhythm  Abdomen: Soft, nontender.  Incision well healed   Vulva:  normal  Vagina: normal vagina  Cervix:  closed  Corpus: Well involuted     Rectal Exam: no hemorrhoids        Assessment:o    normal postpartum exam.  Plan:    1. Contraception: oral progesterone-only contraceptive 2. Follow up in:   or as needed.

## 2015-02-05 NOTE — Patient Instructions (Signed)
Plantar fascitiis

## 2015-02-25 ENCOUNTER — Telehealth: Payer: Self-pay | Admitting: Obstetrics and Gynecology

## 2015-02-25 NOTE — Telephone Encounter (Signed)
Pt states "thinks she needs something for her nerves." Pt states her infant is not tolerating formula and cries a lot. Pt states she has started back smoking and feels like she is eating a lot due to nerves. Pt given an appt tomorrow with Joellyn Haff, CNM for evaluation.

## 2015-02-26 ENCOUNTER — Ambulatory Visit: Payer: Medicaid Other | Admitting: Women's Health

## 2015-05-13 ENCOUNTER — Telehealth: Payer: Self-pay | Admitting: *Deleted

## 2015-05-13 NOTE — Telephone Encounter (Signed)
Pt states taking Micronor for birth control pill, c/o hair loss. Pt requesting a different BCP. Pt saw Cathie BeamsFran Cresenzo-Dishmon, CNM on 02/05/2015 for postpartum visit.

## 2015-05-14 MED ORDER — NORGESTIMATE-ETH ESTRADIOL 0.25-35 MG-MCG PO TABS: 1.0000 | ORAL_TABLET | Freq: Every day | ORAL | Status: DC

## 2015-05-14 NOTE — Telephone Encounter (Signed)
rx sprintec. Not breastfeeding

## 2015-05-20 NOTE — Telephone Encounter (Signed)
Unable to reach pt x4

## 2015-08-02 ENCOUNTER — Other Ambulatory Visit: Payer: Self-pay | Admitting: Obstetrics & Gynecology

## 2016-12-14 ENCOUNTER — Encounter (HOSPITAL_COMMUNITY): Payer: Self-pay | Admitting: Emergency Medicine

## 2016-12-14 ENCOUNTER — Emergency Department (HOSPITAL_COMMUNITY): Payer: Self-pay

## 2016-12-14 ENCOUNTER — Emergency Department (HOSPITAL_BASED_OUTPATIENT_CLINIC_OR_DEPARTMENT_OTHER)
Admit: 2016-12-14 | Discharge: 2016-12-14 | Disposition: A | Discharge status: Home / Self Care | Payer: Medicaid Other | Attending: Emergency Medicine | Admitting: Emergency Medicine

## 2016-12-14 ENCOUNTER — Emergency Department (HOSPITAL_COMMUNITY)
Admission: EM | Admit: 2016-12-14 | Discharge: 2016-12-14 | Disposition: A | Discharge status: Home / Self Care | Payer: Self-pay | Attending: Emergency Medicine | Admitting: Emergency Medicine

## 2016-12-14 DIAGNOSIS — Z87891 Personal history of nicotine dependence: Secondary | ICD-10-CM | POA: Insufficient documentation

## 2016-12-14 DIAGNOSIS — M79609 Pain in unspecified limb: Secondary | ICD-10-CM

## 2016-12-14 DIAGNOSIS — M7989 Other specified soft tissue disorders: Secondary | ICD-10-CM | POA: Diagnosis not present

## 2016-12-14 DIAGNOSIS — M25562 Pain in left knee: Secondary | ICD-10-CM | POA: Insufficient documentation

## 2016-12-14 DIAGNOSIS — G8929 Other chronic pain: Secondary | ICD-10-CM | POA: Insufficient documentation

## 2016-12-14 NOTE — ED Triage Notes (Signed)
Pt sts left leg pain behind knee and into calf x weeks

## 2016-12-14 NOTE — Progress Notes (Signed)
VASCULAR LAB PRELIMINARY  PRELIMINARY  PRELIMINARY  PRELIMINARY  Left lower extremity venous duplex completed.    Preliminary report:  There is no DVT, SVT, or Baker's Cyst noted in the left lower extremity.  Gave report to Dr. Johann CapersPickering  Pennelope Basque, [facility], RVT 12/14/2016, 1:27 PM

## 2016-12-14 NOTE — ED Provider Notes (Signed)
[facility]-EMERGENCY DEPT Provider Note   CSN: [id number] Arrival date & time: 12/14/16  1100  By signing my name below, I, Amanda Sanchez, attest that this documentation has been prepared under the direction and in the presence of Amanda Core, MD. Electronically Signed: Sonum Sanchez, Scribe. 12/14/16. 12:50 PM.  History   Chief Complaint Chief Complaint  Patient presents with  . Leg Pain    The history is provided by the patient. No language interpreter was used.     HPI Comments: Amanda HOUGLAND is a 36 y.o. female who presents to the Emergency Department complaining of a few weeks of posterior left knee pain that is now radiating down the left lower leg. She complains of occasional buckling of the same leg and notes having pain with standing from a sitting position. She notes being evaluated for a DVT 2 years ago due to having a warm sensation to the same area on a flight. She states it was negative at that time but notes intermittent issues with the same extremity since then. She denies CP, SOB. She is a daily smoker. She denies birth control use.   Past Medical History:  Diagnosis Date  . Anemia   . Anxiety    no meds  . Complete bicornuate uterus    2 cervices  . Heartburn in pregnancy    zantac prn  . Hyperlipidemia    diet controlled, no med  . Obesity   . Uterus didelphys 2 cervices  . Vaginal discharge during pregnancy in first trimester 05/02/2014  . Yeast infection 05/02/2014    Patient Active Problem List   Diagnosis Date Noted  . S/P cesarean section 12/21/2014  . Pica 11/20/2014  . Rh negative state in antepartum period 06/13/2014  . History of cesarean delivery, currently pregnant 05/16/2014  . Supervision of normal pregnancy 05/16/2014  . Uterus didelphys   . Yeast infection 05/02/2014  . Candidal intertrigo 10/27/2011  . Morbid obesity (HCC) 10/20/2011  . Hyperlipidemia 10/20/2011  . Anemia 10/20/2011  . Tobacco user 10/20/2011    Past Surgical  History:  Procedure Laterality Date  . CESAREAN SECTION  06/2009   [facility]  . CESAREAN SECTION N/A 12/21/2014   Procedure: REPEAT CESAREAN SECTION;  Surgeon: Lazaro Arms, MD;  Location: [facility];  Service: Obstetrics;  Laterality: N/A;  . WISDOM TOOTH EXTRACTION      OB History    Gravida Para Term Preterm AB Living   3 2 2   1 2    SAB TAB Ectopic Multiple Live Births   1     0 2       Home Medications    Prior to Admission medications   Medication Sig Start Date End Date Taking? Authorizing Provider  norethindrone (MICRONOR,CAMILA,ERRIN) 0.35 MG tablet Take 1 tablet (0.35 mg total) by mouth daily. Take 1 a day 01/04/15   Lazaro Arms, MD  norgestimate-ethinyl estradiol (ORTHO-CYCLEN,SPRINTEC,PREVIFEM) 0.25-35 MG-MCG tablet Take 1 tablet by mouth daily. 05/14/15   Jacklyn Shell, CNM    Family History Family History  Problem Relation Age of Onset  . Diabetes Maternal Grandfather   . Hypertension Maternal Grandfather   . Diabetes Paternal Grandmother   . Hypertension Paternal Grandmother   . Diabetes Paternal Grandfather   . Hypertension Paternal Grandfather   . Fibroids Mother   . Hyperlipidemia Father     Social History Social History  Substance Use Topics  . Smoking status: Former Smoker    Packs/day: 0.10  Years: 15.00    Types: Cigarettes    Quit date: 04/26/2014  . Smokeless tobacco: Never Used  . Alcohol use Yes     Comment: social but none with pregnancy     Allergies   Patient has no known allergies.   Review of Systems Review of Systems  Respiratory: Negative for shortness of breath.   Cardiovascular: Negative for chest pain.  Musculoskeletal: Positive for arthralgias, joint swelling and myalgias.  All other systems reviewed and are negative.    Physical Exam Updated Vital Signs BP 120/82   Pulse 78   Temp 98.2 F (36.8 C) (Oral)   Resp 16   LMP 11/25/2016   SpO2 100%   Physical Exam  Constitutional: She is oriented to  person, place, and time. She appears well-developed and well-nourished. No distress.  HENT:  Head: Normocephalic and atraumatic.  Eyes: EOM are normal.  Neck: Normal range of motion.  Cardiovascular: Normal rate, regular rhythm and normal heart sounds.   Pulmonary/Chest: Effort normal and breath sounds normal.  Abdominal: Soft. She exhibits no distension. There is no tenderness.  Musculoskeletal: Normal range of motion. She exhibits edema and tenderness.       Left knee: She exhibits effusion.  Mild edema to left lower leg. Mild effusion to the left knee. Good ROM of the LLE. No tenderness with verus or valgus strain. No fullness to posterior left knee. No skin changes. NVI in both feet.   Neurological: She is alert and oriented to person, place, and time. No sensory deficit.  Skin: Skin is warm and dry.  Psychiatric: She has a normal mood and affect. Judgment normal.  Nursing note and vitals reviewed.    ED Treatments / Results    COORDINATION OF CARE: 12:28 PM Discussed treatment plan with pt at bedside and pt agreed to plan.   Labs (all labs ordered are listed, but only abnormal results are displayed) Labs Reviewed - No data to display  EKG  EKG Interpretation None       Radiology Dg Knee Complete 4 Views Left  Result Date: 12/14/2016 CLINICAL DATA:  Chronic left knee pain.  No reported injury. EXAM: LEFT KNEE - COMPLETE 4+ VIEW COMPARISON:  None. FINDINGS: No evidence of fracture, dislocation, or joint effusion. No evidence of arthropathy or other focal bone abnormality. Soft tissues are unremarkable. IMPRESSION: Normal left knee. Electronically Signed   By: Amanda Sanchez  Green Jr, M.D.   On: 12/14/2016 14:06    Procedures Procedures (including critical care time)  Medications Ordered in ED Medications - No data to display   Initial Impression / Assessment and Plan / ED Course  I have reviewed the triage vital signs and the nursing notes.  Pertinent labs & imaging  results that were available during my care of the patient were reviewed by me and considered in my medical decision making (see chart for details).     Patient presents with knee pain. Has had it for a while but worse recently. States she has had no injury. Does have some edema. X-ray and Doppler negative. Overall benign exam. Discharge home with outpatient follow-up as needed.  Final Clinical Impressions(s) / ED Diagnoses   Final diagnoses:  Chronic pain of left knee    New Prescriptions Discharge Medication List as of 12/14/2016  2:21 PM    I personally performed the services described in this documentation, which was scribed in my presence. The recorded information has been reviewed and is accurate.  Amanda Core, MD 12/14/16 2028

## 2016-12-14 NOTE — ED Notes (Signed)
US at bedside

## 2016-12-14 NOTE — ED Notes (Signed)
Pt c/o of left leg pain that starts behind knee and radiates down calf and ankle.  Denies injury.  No hx of blood clot.  Ambulatory.

## 2018-03-29 ENCOUNTER — Telehealth: Payer: Self-pay | Admitting: Women's Health

## 2018-03-29 NOTE — Telephone Encounter (Signed)
Pt just had a positve preg test and called to after hour nurse during lunch and was having some concerns. Please call and advise.

## 2018-03-29 NOTE — Telephone Encounter (Signed)
Called patient back and she states that the after hours nurse told her that she needs to be seen in 3 hours. She has had bleeding with clots and cramps. LMP was 02/21/18. She reports that the cramps are not necessarily painful just annoying. She reports that she has some light pink when she wipes. Spoke with Victorino Dike, patient does not need to be seen today. Patient to keep an eye on symptoms, if she develops severe pain she knows to go to [facility]/nearest ED. Patient has no further questions at this time.

## 2018-04-11 ENCOUNTER — Ambulatory Visit (INDEPENDENT_AMBULATORY_CARE_PROVIDER_SITE_OTHER): Payer: 59 | Admitting: Adult Health

## 2018-04-11 ENCOUNTER — Encounter: Payer: Self-pay | Admitting: Adult Health

## 2018-04-11 VITALS — BP 117/74 | Ht 64.0 in | Wt 282.2 lb

## 2018-04-11 DIAGNOSIS — O3680X0 Pregnancy with inconclusive fetal viability, not applicable or unspecified: Secondary | ICD-10-CM

## 2018-04-11 DIAGNOSIS — Z3201 Encounter for pregnancy test, result positive: Secondary | ICD-10-CM | POA: Diagnosis not present

## 2018-04-11 DIAGNOSIS — O34219 Maternal care for unspecified type scar from previous cesarean delivery: Secondary | ICD-10-CM

## 2018-04-11 DIAGNOSIS — Q512 Other doubling of uterus, unspecified: Secondary | ICD-10-CM

## 2018-04-11 DIAGNOSIS — O09521 Supervision of elderly multigravida, first trimester: Secondary | ICD-10-CM | POA: Insufficient documentation

## 2018-04-11 DIAGNOSIS — Q5128 Other doubling of uterus, other specified: Secondary | ICD-10-CM

## 2018-04-11 DIAGNOSIS — N926 Irregular menstruation, unspecified: Secondary | ICD-10-CM | POA: Diagnosis not present

## 2018-04-11 DIAGNOSIS — Z3A01 Less than 8 weeks gestation of pregnancy: Secondary | ICD-10-CM

## 2018-04-11 LAB — POCT URINE PREGNANCY: Preg Test, Ur: POSITIVE — AB

## 2018-04-11 MED ORDER — PRENATAL VITAMIN AND MINERAL 28-0.8 MG PO TABS: 1.0000 | ORAL_TABLET | Freq: Every day | ORAL | Status: DC

## 2018-04-11 NOTE — Progress Notes (Signed)
  Subjective:     Patient ID: Conchita ParisNatasha L Schomburg, female   DOB: 07/29/1980, 37 y.o.   MRN: [id number]  HPI Marcelle Smilingatasha is a 37 year old black female, in for UPT, has missed a period and had +HPT.  Review of Systems +missed period with +HPT Reviewed past medical,surgical, social and family history. Reviewed medications and allergies.     Objective:   Physical Exam BP 117/74 (BP Location: Left Arm, Patient Position: Sitting, Cuff Size: Large)   Ht 5\' 4"  (1.626 m)   Wt 282 lb 3.2 oz (128 kg)   LMP 02/21/2018   BMI 48.44 kg/m    UPT =, about 7 weeks by LMP with EDD 11/29/18.Skin warm and dry. Neck: mid line trachea, normal thyroid, good ROM, no lymphadenopathy noted. Lungs: clear to ausculation bilaterally. Cardiovascular: regular rate and rhythm. Abdomen is soft and nontnder. Assessment:     1. Pregnancy test positive   2. Less than [redacted] weeks gestation of pregnancy   3. Encounter to determine fetal viability of pregnancy, single or unspecified fetus   4. Advanced maternal age in multigravida, first trimester   5. Uterus didelphys   6. History of cesarean delivery, currently pregnant       Plan:     Continue PNV Dating US scheduled [facility] 9/24 at 8:30 am Return in 3 weeks for New OB visit Review handouts on First trimester and by [facility]

## 2018-04-11 NOTE — Patient Instructions (Signed)
First Trimester of Pregnancy The first trimester of pregnancy is from week 1 until the end of week 13 (months 1 through 3). A week after a sperm fertilizes an egg, the egg will implant on the wall of the uterus. This embryo will begin to develop into a baby. Genes from you and your partner will form the baby. The female genes will determine whether the baby will be a boy or a girl. At 6-8 weeks, the eyes and face will be formed, and the heartbeat can be seen on ultrasound. At the end of 12 weeks, all the baby's organs will be formed. Now that you are pregnant, you will want to do everything you can to have a healthy baby. Two of the most important things are to get good prenatal care and to follow your health care provider's instructions. Prenatal care is all the medical care you receive before the baby's birth. This care will help prevent, find, and treat any problems during the pregnancy and childbirth. Body changes during your first trimester Your body goes through many changes during pregnancy. The changes vary from woman to woman.  You may gain or lose a couple of pounds at first.  You may feel sick to your stomach (nauseous) and you may throw up (vomit). If the vomiting is uncontrollable, call your health care provider.  You may tire easily.  You may develop headaches that can be relieved by medicines. All medicines should be approved by your health care provider.  You may urinate more often. Painful urination may mean you have a bladder infection.  You may develop heartburn as a result of your pregnancy.  You may develop constipation because certain hormones are causing the muscles that push stool through your intestines to slow down.  You may develop hemorrhoids or swollen veins (varicose veins).  Your breasts may begin to grow larger and become tender. Your nipples may stick out more, and the tissue that surrounds them (areola) may become darker.  Your gums may bleed and may be  sensitive to brushing and flossing.  Dark spots or blotches (chloasma, mask of pregnancy) may develop on your face. This will likely fade after the baby is born.  Your menstrual periods will stop.  You may have a loss of appetite.  You may develop cravings for certain kinds of food.  You may have changes in your emotions from day to day, such as being excited to be pregnant or being concerned that something may go wrong with the pregnancy and baby.  You may have more vivid and strange dreams.  You may have changes in your hair. These can include thickening of your hair, rapid growth, and changes in texture. Some women also have hair loss during or after pregnancy, or hair that feels dry or thin. Your hair will most likely return to normal after your baby is born.  What to expect at prenatal visits During a routine prenatal visit:  You will be weighed to make sure you and the baby are growing normally.  Your blood pressure will be taken.  Your abdomen will be measured to track your baby's growth.  The fetal heartbeat will be listened to between weeks 10 and 14 of your pregnancy.  Test results from any previous visits will be discussed.  Your health care provider may ask you:  How you are feeling.  If you are feeling the baby move.  If you have had any abnormal symptoms, such as leaking fluid, bleeding, severe headaches,   or abdominal cramping.  If you are using any tobacco products, including cigarettes, chewing tobacco, and electronic cigarettes.  If you have any questions.  Other tests that may be performed during your first trimester include:  Blood tests to find your blood type and to check for the presence of any previous infections. The tests will also be used to check for low iron levels (anemia) and protein on red blood cells (Rh antibodies). Depending on your risk factors, or if you previously had diabetes during pregnancy, you may have tests to check for high blood  sugar that affects pregnant women (gestational diabetes).  Urine tests to check for infections, diabetes, or protein in the urine.  An ultrasound to confirm the proper growth and development of the baby.  Fetal screens for spinal cord problems (spina bifida) and Down syndrome.  HIV (human immunodeficiency virus) testing. Routine prenatal testing includes screening for HIV, unless you choose not to have this test.  You may need other tests to make sure you and the baby are doing well.  Follow these instructions at home: Medicines  Follow your health care provider's instructions regarding medicine use. Specific medicines may be either safe or unsafe to take during pregnancy.  Take a prenatal vitamin that contains at least 600 micrograms (mcg) of folic acid.  If you develop constipation, try taking a stool softener if your health care provider approves. Eating and drinking  Eat a balanced diet that includes fresh fruits and vegetables, whole grains, good sources of protein such as meat, eggs, or tofu, and low-fat dairy. Your health care provider will help you determine the amount of weight gain that is right for you.  Avoid raw meat and uncooked cheese. These carry germs that can cause birth defects in the baby.  Eating four or five small meals rather than three large meals a day may help relieve nausea and vomiting. If you start to feel nauseous, eating a few soda crackers can be helpful. Drinking liquids between meals, instead of during meals, also seems to help ease nausea and vomiting.  Limit foods that are high in fat and processed sugars, such as fried and sweet foods.  To prevent constipation: ? Eat foods that are high in fiber, such as fresh fruits and vegetables, whole grains, and beans. ? Drink enough fluid to keep your urine clear or pale yellow. Activity  Exercise only as directed by your health care provider. Most women can continue their usual exercise routine during  pregnancy. Try to exercise for 30 minutes at least 5 days a week. Exercising will help you: ? Control your weight. ? Stay in shape. ? Be prepared for labor and delivery.  Experiencing pain or cramping in the lower abdomen or lower back is a good sign that you should stop exercising. Check with your health care provider before continuing with normal exercises.  Try to avoid standing for long periods of time. Move your legs often if you must stand in one place for a long time.  Avoid heavy lifting.  Wear low-heeled shoes and practice good posture.  You may continue to have sex unless your health care provider tells you not to. Relieving pain and discomfort  Wear a good support bra to relieve breast tenderness.  Take warm sitz baths to soothe any pain or discomfort caused by hemorrhoids. Use hemorrhoid cream if your health care provider approves.  Rest with your legs elevated if you have leg cramps or low back pain.  If you develop   varicose veins in your legs, wear support hose. Elevate your feet for 15 minutes, 3-4 times a day. Limit salt in your diet. Prenatal care  Schedule your prenatal visits by the twelfth week of pregnancy. They are usually scheduled monthly at first, then more often in the last 2 months before delivery.  Write down your questions. Take them to your prenatal visits.  Keep all your prenatal visits as told by your health care provider. This is important. Safety  Wear your seat belt at all times when driving.  Make a list of emergency phone numbers, including numbers for family, friends, the hospital, and police and fire departments. General instructions  Ask your health care provider for a referral to a local prenatal education class. Begin classes no later than the beginning of month 6 of your pregnancy.  Ask for help if you have counseling or nutritional needs during pregnancy. Your health care provider can offer advice or refer you to specialists for help  with various needs.  Do not use hot tubs, steam rooms, or saunas.  Do not douche or use tampons or scented sanitary pads.  Do not cross your legs for long periods of time.  Avoid cat litter boxes and soil used by cats. These carry germs that can cause birth defects in the baby and possibly loss of the fetus by miscarriage or stillbirth.  Avoid all smoking, herbs, alcohol, and medicines not prescribed by your health care provider. Chemicals in these products affect the formation and growth of the baby.  Do not use any products that contain nicotine or tobacco, such as cigarettes and e-cigarettes. If you need help quitting, ask your health care provider. You may receive counseling support and other resources to help you quit.  Schedule a dentist appointment. At home, brush your teeth with a soft toothbrush and be gentle when you floss. Contact a health care provider if:  You have dizziness.  You have mild pelvic cramps, pelvic pressure, or nagging pain in the abdominal area.  You have persistent nausea, vomiting, or diarrhea.  You have a bad smelling vaginal discharge.  You have pain when you urinate.  You notice increased swelling in your face, hands, legs, or ankles.  You are exposed to fifth disease or chickenpox.  You are exposed to German measles (rubella) and have never had it. Get help right away if:  You have a fever.  You are leaking fluid from your vagina.  You have spotting or bleeding from your vagina.  You have severe abdominal cramping or pain.  You have rapid weight gain or loss.  You vomit blood or material that looks like coffee grounds.  You develop a severe headache.  You have shortness of breath.  You have any kind of trauma, such as from a fall or a car accident. Summary  The first trimester of pregnancy is from week 1 until the end of week 13 (months 1 through 3).  Your body goes through many changes during pregnancy. The changes vary from  woman to woman.  You will have routine prenatal visits. During those visits, your health care provider will examine you, discuss any test results you may have, and talk with you about how you are feeling. This information is not intended to replace advice given to you by your health care provider. Make sure you discuss any questions you have with your health care provider. Document Released: 07/07/2001 Document Revised: 06/24/2016 Document Reviewed: 06/24/2016 Elsevier Interactive Patient Education  2018 Elsevier   Inc.  

## 2018-04-19 ENCOUNTER — Ambulatory Visit (HOSPITAL_COMMUNITY): Payer: Self-pay

## 2018-04-20 ENCOUNTER — Ambulatory Visit (HOSPITAL_COMMUNITY): Payer: Medicaid Other

## 2018-04-21 ENCOUNTER — Ambulatory Visit (HOSPITAL_COMMUNITY)
Admission: RE | Admit: 2018-04-21 | Discharge: 2018-04-21 | Disposition: A | Discharge status: Home / Self Care | Payer: Medicaid Other | Source: Ambulatory Visit | Attending: Adult Health | Admitting: Adult Health

## 2018-04-21 DIAGNOSIS — Z3A08 8 weeks gestation of pregnancy: Secondary | ICD-10-CM | POA: Insufficient documentation

## 2018-04-21 DIAGNOSIS — O3680X0 Pregnancy with inconclusive fetal viability, not applicable or unspecified: Secondary | ICD-10-CM | POA: Diagnosis present

## 2018-05-02 ENCOUNTER — Telehealth: Payer: Self-pay | Admitting: Women's Health

## 2018-05-02 ENCOUNTER — Other Ambulatory Visit (HOSPITAL_COMMUNITY)
Admission: RE | Admit: 2018-05-02 | Discharge: 2018-05-02 | Disposition: A | Discharge status: Home / Self Care | Payer: Medicaid Other | Source: Ambulatory Visit | Attending: Obstetrics & Gynecology | Admitting: Obstetrics & Gynecology

## 2018-05-02 ENCOUNTER — Ambulatory Visit: Payer: 59 | Admitting: *Deleted

## 2018-05-02 ENCOUNTER — Encounter: Payer: Self-pay | Admitting: Women's Health

## 2018-05-02 ENCOUNTER — Ambulatory Visit (INDEPENDENT_AMBULATORY_CARE_PROVIDER_SITE_OTHER): Payer: 59 | Admitting: Women's Health

## 2018-05-02 ENCOUNTER — Other Ambulatory Visit: Payer: Self-pay | Admitting: Women's Health

## 2018-05-02 VITALS — BP 138/90 | HR 86 | Wt 287.0 lb

## 2018-05-02 DIAGNOSIS — O09521 Supervision of elderly multigravida, first trimester: Secondary | ICD-10-CM

## 2018-05-02 DIAGNOSIS — Z3481 Encounter for supervision of other normal pregnancy, first trimester: Secondary | ICD-10-CM | POA: Diagnosis present

## 2018-05-02 DIAGNOSIS — Z331 Pregnant state, incidental: Secondary | ICD-10-CM | POA: Insufficient documentation

## 2018-05-02 DIAGNOSIS — Z3682 Encounter for antenatal screening for nuchal translucency: Secondary | ICD-10-CM

## 2018-05-02 DIAGNOSIS — O26899 Other specified pregnancy related conditions, unspecified trimester: Secondary | ICD-10-CM

## 2018-05-02 DIAGNOSIS — Z1389 Encounter for screening for other disorder: Secondary | ICD-10-CM

## 2018-05-02 DIAGNOSIS — O34219 Maternal care for unspecified type scar from previous cesarean delivery: Secondary | ICD-10-CM | POA: Diagnosis not present

## 2018-05-02 DIAGNOSIS — Z3A10 10 weeks gestation of pregnancy: Secondary | ICD-10-CM | POA: Insufficient documentation

## 2018-05-02 DIAGNOSIS — Z6791 Unspecified blood type, Rh negative: Secondary | ICD-10-CM

## 2018-05-02 DIAGNOSIS — Q5128 Other doubling of uterus, other specified: Secondary | ICD-10-CM

## 2018-05-02 DIAGNOSIS — Q512 Other doubling of uterus, unspecified: Secondary | ICD-10-CM

## 2018-05-02 DIAGNOSIS — O360110 Maternal care for anti-D [Rh] antibodies, first trimester, not applicable or unspecified: Secondary | ICD-10-CM

## 2018-05-02 DIAGNOSIS — Z3490 Encounter for supervision of normal pregnancy, unspecified, unspecified trimester: Secondary | ICD-10-CM | POA: Insufficient documentation

## 2018-05-02 LAB — POCT URINALYSIS DIPSTICK OB
Blood, UA: NEGATIVE
GLUCOSE, UA: NEGATIVE
KETONES UA: NEGATIVE
Leukocytes, UA: NEGATIVE
NITRITE UA: NEGATIVE
POC,PROTEIN,UA: NEGATIVE

## 2018-05-02 MED ORDER — PRENATE PIXIE 10-0.6-0.4-200 MG PO CAPS: ORAL_CAPSULE | ORAL | 11 refills | Status: DC

## 2018-05-02 MED ORDER — OB COMPLETE PETITE 35-5-1-200 MG PO CAPS: ORAL_CAPSULE | ORAL | 11 refills | Status: DC

## 2018-05-02 NOTE — Progress Notes (Signed)
INITIAL OBSTETRICAL VISIT Patient name: Amanda Sanchez MRN [id number]  Date of birth: [birth year] Chief Complaint:   Initial Prenatal Visit  History of Present Illness:   Amanda Sanchez is a 37 y.o. [id number] African American female at [redacted]w[redacted]d by LMP c/w 8wk u/s, with an Estimated Date of Delivery: 11/28/18 being seen today for her initial obstetrical visit.   Her obstetrical history is significant for SAB x 1, term uncomplicated C/S x 2 (1st elective d/t uterine didelphys, 2nd repeat elective), uterine didelphys.   H/O anxiety (never formally dx), no meds, doing well BP elevated today, denies h/o HTN Today she reports no complaints.  Patient's last menstrual period was 02/21/2018. Last pap 2015. Results were: normal Review of Systems:   Pertinent items are noted in HPI Denies cramping/contractions, leakage of fluid, vaginal bleeding, abnormal vaginal discharge w/ itching/odor/irritation, headaches, visual changes, shortness of breath, chest pain, abdominal pain, severe nausea/vomiting, or problems with urination or bowel movements unless otherwise stated above.  Pertinent History Reviewed:  Reviewed past medical,surgical, social, obstetrical and family history.  Reviewed problem list, medications and allergies. OB History  Gravida Para Term Preterm AB Living  4 2 2   1 2   SAB TAB Ectopic Multiple Live Births  1     0 2    # Outcome Date GA Lbr Len/2nd Weight Sex Delivery Anes PTL Lv  4 Current           3 Term 12/21/14 [redacted]w[redacted]d  6 lb 14.2 oz (3.125 kg) M CS-LTranv Spinal N LIV  2 Term 07/15/09 [redacted]w[redacted]d  6 lb 9 oz (2.977 kg) M CS-Unspec Gen N LIV     Birth Comments: elective d/t uterus didelphys  1 SAB 06/26/08           Physical Assessment:   Vitals:   05/02/18 1125  BP: 138/90  Pulse: 86  Weight: 287 lb (130.2 kg)  Body mass index is 49.26 kg/m.       Physical Examination:  General appearance - well appearing, and in no distress  Mental status - alert, oriented to person, place,  and time  Psych:  She has a normal mood and affect  Skin - warm and dry, normal color, no suspicious lesions noted  Chest - effort normal, all lung fields clear to auscultation bilaterally  Heart - normal rate and regular rhythm  Abdomen - soft, nontender  Extremities:  No swelling or varicosities noted  Pelvic - VULVA: normal appearing vulva with no masses, tenderness or lesions  VAGINA: normal appearing vagina with normal color and discharge, no lesions  CERVIX: normal appearing cervix without discharge or lesions, no CMT  Thin prep pap is done w/ HR HPV cotesting-only 1 cx visible  Fetal Heart Rate (bpm): +u/s via informal transabdominal u/s  Results for orders placed or performed in visit on 05/02/18 (from the past 24 hour(s))  POC Urinalysis Dipstick OB   Collection Time: 05/02/18 12:04 PM  Result Value Ref Range   Color, UA     Clarity, UA     Glucose, UA Negative Negative   Bilirubin, UA     Ketones, UA neg    Spec Grav, UA     Blood, UA neg    pH, UA     POC Protein UA Negative Negative, Trace   Urobilinogen, UA     Nitrite, UA neg    Leukocytes, UA Negative Negative   Appearance     Odor  Assessment & Plan:  1) Low-Risk Pregnancy [id number] at [redacted]w[redacted]d with an Estimated Date of Delivery: 11/28/18   2) Initial OB visit  3) AMA 37yo  4) Uterine didelphys  5) Prev c/s x 2> for repeat w/ BTL  Meds:  Meds ordered this encounter  Medications  . Prenat-FeAsp-Meth-FA-DHA w/o A (PRENATE PIXIE) 10-0.6-0.4-200 MG CAPS    Sig: 1 capsule daily    Dispense:  30 capsule    Refill:  11    Order Specific Question:   Supervising Provider    Answer:   Duane Lope H [[id number]]    Initial labs obtained Continue prenatal vitamins Reviewed n/v relief measures and warning s/s to report Reviewed recommended weight gain based on pre-gravid BMI Encouraged well-balanced diet Genetic Screening discussed Integrated Screen: requested Cystic fibrosis screening discussed neg prev  preg Ultrasound discussed; fetal survey: requested CCNC completed>spoke w/ [place] Declined flu shot  Follow-up: Return in about 3 weeks (around 05/23/2018) for US:NT+1stIT, LROB.   Orders Placed This Encounter  Procedures  . Urine Culture  . US Fetal Nuchal Translucency Measurement  . Urinalysis, Routine w reflex microscopic  . Obstetric Panel, Including HIV  . Pain Management Screening Profile (10S)  . POC Urinalysis Dipstick OB    Cheral Marker CNM, WHNP-BC 05/02/2018 1:12 PM

## 2018-05-02 NOTE — Patient Instructions (Signed)
Amanda Sanchez, I greatly value your feedback.  If you receive a survey following your visit with [place] today, we appreciate you taking the time to fill it out.  Thanks, Joellyn Haff, CNM, WHNP-BC   Nausea & Vomiting  Have saltine crackers or pretzels by your bed and eat a few bites before you raise your head out of bed in the morning  Eat small frequent meals throughout the day instead of large meals  Drink plenty of fluids throughout the day to stay hydrated, just don't drink a lot of fluids with your meals.  This can make your stomach fill up faster making you feel sick  Do not brush your teeth right after you eat  Products with real ginger are good for nausea, like ginger ale and ginger hard candy Make sure it says made with real ginger!  Sucking on sour candy like lemon heads is also good for nausea  If your prenatal vitamins make you nauseated, take them at night so you will sleep through the nausea  Sea Bands  If you feel like you need medicine for the nausea & vomiting please let us know  If you are unable to keep any fluids or food down please let us know   Constipation  Drink plenty of fluid, preferably water, throughout the day  Eat foods high in fiber such as fruits, vegetables, and grains  Exercise, such as walking, is a good way to keep your bowels regular  Drink warm fluids, especially warm prune juice, or decaf coffee  Eat a 1/2 cup of real oatmeal (not instant), 1/2 cup applesauce, and 1/2-1 cup warm prune juice every day  If needed, you may take Colace (docusate sodium) stool softener once or twice a day to help keep the stool soft. If you are pregnant, wait until you are out of your first trimester (12-14 weeks of pregnancy)  If you still are having problems with constipation, you may take Miralax once daily as needed to help keep your bowels regular.  If you are pregnant, wait until you are out of your first trimester (12-14 weeks of pregnancy)   First  Trimester of Pregnancy The first trimester of pregnancy is from week 1 until the end of week 12 (months 1 through 3). A week after a sperm fertilizes an egg, the egg will implant on the wall of the uterus. This embryo will begin to develop into a baby. Genes from you and your partner are forming the baby. The female genes determine whether the baby is a boy or a girl. At 6-8 weeks, the eyes and face are formed, and the heartbeat can be seen on ultrasound. At the end of 12 weeks, all the baby's organs are formed.  Now that you are pregnant, you will want to do everything you can to have a healthy baby. Two of the most important things are to get good prenatal care and to follow your health care provider's instructions. Prenatal care is all the medical care you receive before the baby's birth. This care will help prevent, find, and treat any problems during the pregnancy and childbirth. BODY CHANGES Your body goes through many changes during pregnancy. The changes vary from woman to woman.   You may gain or lose a couple of pounds at first.  You may feel sick to your stomach (nauseous) and throw up (vomit). If the vomiting is uncontrollable, call your health care provider.  You may tire easily.  You may develop headaches  that can be relieved by medicines approved by your health care provider.  You may urinate more often. Painful urination may mean you have a bladder infection.  You may develop heartburn as a result of your pregnancy.  You may develop constipation because certain hormones are causing the muscles that push waste through your intestines to slow down.  You may develop hemorrhoids or swollen, bulging veins (varicose veins).  Your breasts may begin to grow larger and become tender. Your nipples may stick out more, and the tissue that surrounds them (areola) may become darker.  Your gums may bleed and may be sensitive to brushing and flossing.  Dark spots or blotches (chloasma, mask  of pregnancy) may develop on your face. This will likely fade after the baby is born.  Your menstrual periods will stop.  You may have a loss of appetite.  You may develop cravings for certain kinds of food.  You may have changes in your emotions from day to day, such as being excited to be pregnant or being concerned that something may go wrong with the pregnancy and baby.  You may have more vivid and strange dreams.  You may have changes in your hair. These can include thickening of your hair, rapid growth, and changes in texture. Some women also have hair loss during or after pregnancy, or hair that feels dry or thin. Your hair will most likely return to normal after your baby is born. WHAT TO EXPECT AT YOUR PRENATAL VISITS During a routine prenatal visit:  You will be weighed to make sure you and the baby are growing normally.  Your blood pressure will be taken.  Your abdomen will be measured to track your baby's growth.  The fetal heartbeat will be listened to starting around week 10 or 12 of your pregnancy.  Test results from any previous visits will be discussed. Your health care provider may ask you:  How you are feeling.  If you are feeling the baby move.  If you have had any abnormal symptoms, such as leaking fluid, bleeding, severe headaches, or abdominal cramping.  If you have any questions. Other tests that may be performed during your first trimester include:  Blood tests to find your blood type and to check for the presence of any previous infections. They will also be used to check for low iron levels (anemia) and Rh antibodies. Later in the pregnancy, blood tests for diabetes will be done along with other tests if problems develop.  Urine tests to check for infections, diabetes, or protein in the urine.  An ultrasound to confirm the proper growth and development of the baby.  An amniocentesis to check for possible genetic problems.  Fetal screens for spina  bifida and Down syndrome.  You may need other tests to make sure you and the baby are doing well. HOME CARE INSTRUCTIONS  Medicines  Follow your health care provider's instructions regarding medicine use. Specific medicines may be either safe or unsafe to take during pregnancy.  Take your prenatal vitamins as directed.  If you develop constipation, try taking a stool softener if your health care provider approves. Diet  Eat regular, well-balanced meals. Choose a variety of foods, such as meat or vegetable-based protein, fish, milk and low-fat dairy products, vegetables, fruits, and whole grain breads and cereals. Your health care provider will help you determine the amount of weight gain that is right for you.  Avoid raw meat and uncooked cheese. These carry germs that can  cause birth defects in the baby.  Eating four or five small meals rather than three large meals a day may help relieve nausea and vomiting. If you start to feel nauseous, eating a few soda crackers can be helpful. Drinking liquids between meals instead of during meals also seems to help nausea and vomiting.  If you develop constipation, eat more high-fiber foods, such as fresh vegetables or fruit and whole grains. Drink enough fluids to keep your urine clear or pale yellow. Activity and Exercise  Exercise only as directed by your health care provider. Exercising will help you:  Control your weight.  Stay in shape.  Be prepared for labor and delivery.  Experiencing pain or cramping in the lower abdomen or low back is a good sign that you should stop exercising. Check with your health care provider before continuing normal exercises.  Try to avoid standing for long periods of time. Move your legs often if you must stand in one place for a long time.  Avoid heavy lifting.  Wear low-heeled shoes, and practice good posture.  You may continue to have sex unless your health care provider directs you  otherwise. Relief of Pain or Discomfort  Wear a good support bra for breast tenderness.   Take warm sitz baths to soothe any pain or discomfort caused by hemorrhoids. Use hemorrhoid cream if your health care provider approves.   Rest with your legs elevated if you have leg cramps or low back pain.  If you develop varicose veins in your legs, wear support hose. Elevate your feet for 15 minutes, 3-4 times a day. Limit salt in your diet. Prenatal Care  Schedule your prenatal visits by the twelfth week of pregnancy. They are usually scheduled monthly at first, then more often in the last 2 months before delivery.  Write down your questions. Take them to your prenatal visits.  Keep all your prenatal visits as directed by your health care provider. Safety  Wear your seat belt at all times when driving.  Make a list of emergency phone numbers, including numbers for family, friends, the hospital, and police and fire departments. General Tips  Ask your health care provider for a referral to a local prenatal education class. Begin classes no later than at the beginning of month 6 of your pregnancy.  Ask for help if you have counseling or nutritional needs during pregnancy. Your health care provider can offer advice or refer you to specialists for help with various needs.  Do not use hot tubs, steam rooms, or saunas.  Do not douche or use tampons or scented sanitary pads.  Do not cross your legs for long periods of time.  Avoid cat litter boxes and soil used by cats. These carry germs that can cause birth defects in the baby and possibly loss of the fetus by miscarriage or stillbirth.  Avoid all smoking, herbs, alcohol, and medicines not prescribed by your health care provider. Chemicals in these affect the formation and growth of the baby.  Schedule a dentist appointment. At home, brush your teeth with a soft toothbrush and be gentle when you floss. SEEK MEDICAL CARE IF:   You have  dizziness.  You have mild pelvic cramps, pelvic pressure, or nagging pain in the abdominal area.  You have persistent nausea, vomiting, or diarrhea.  You have a bad smelling vaginal discharge.  You have pain with urination.  You notice increased swelling in your face, hands, legs, or ankles. SEEK IMMEDIATE MEDICAL CARE IF:  You have a fever.  You are leaking fluid from your vagina.  You have spotting or bleeding from your vagina.  You have severe abdominal cramping or pain.  You have rapid weight gain or loss.  You vomit blood or material that looks like coffee grounds.  You are exposed to [place] measles and have never had them.  You are exposed to fifth disease or chickenpox.  You develop a severe headache.  You have shortness of breath.  You have any kind of trauma, such as from a fall or a car accident. Document Released: 07/07/2001 Document Revised: 11/27/2013 Document Reviewed: 05/23/2013 [facility] Patient Information 2015 [place], [place]. This information is not intended to replace advice given to you by your health care provider. Make sure you discuss any questions you have with your health care provider.

## 2018-05-03 LAB — OBSTETRIC PANEL, INCLUDING HIV
BASOS: 0 %
Basophils Absolute: 0 10*3/uL (ref 0.0–0.2)
EOS (ABSOLUTE): 0.5 10*3/uL — ABNORMAL HIGH (ref 0.0–0.4)
EOS: 4 %
HIV Screen 4th Generation wRfx: NONREACTIVE
Hematocrit: 37.9 % (ref 34.0–46.6)
Hemoglobin: 12.4 g/dL (ref 11.1–15.9)
Hepatitis B Surface Ag: NEGATIVE
IMMATURE GRANS (ABS): 0 10*3/uL (ref 0.0–0.1)
Immature Granulocytes: 0 %
Lymphocytes Absolute: 2.1 10*3/uL (ref 0.7–3.1)
Lymphs: 19 %
MCH: 26.7 pg (ref 26.6–33.0)
MCHC: 32.7 g/dL (ref 31.5–35.7)
MCV: 82 fL (ref 79–97)
Monocytes Absolute: 0.6 10*3/uL (ref 0.1–0.9)
Monocytes: 5 %
Neutrophils Absolute: 8 10*3/uL — ABNORMAL HIGH (ref 1.4–7.0)
Neutrophils: 72 %
Platelets: 307 10*3/uL (ref 150–450)
RBC: 4.65 x10E6/uL (ref 3.77–5.28)
RDW: 13.6 % (ref 12.3–15.4)
RPR Ser Ql: NONREACTIVE
Rh Factor: NEGATIVE
Rubella Antibodies, IGG: 3.69 index (ref >0.99)
WBC: 11.2 10*3/uL — ABNORMAL HIGH (ref 3.4–10.8)

## 2018-05-03 LAB — PMP SCREEN PROFILE (10S), URINE
Amphetamine Scrn, Ur: NEGATIVE ng/mL
BARBITURATE SCREEN URINE: NEGATIVE ng/mL
BENZODIAZEPINE SCREEN, URINE: NEGATIVE ng/mL
CANNABINOIDS UR QL SCN: NEGATIVE ng/mL
Cocaine (Metab) Scrn, Ur: NEGATIVE ng/mL
Creatinine(Crt), U: 190.6 mg/dL (ref 20.0–300.0)
Methadone Screen, Urine: NEGATIVE ng/mL
OXYCODONE+OXYMORPHONE UR QL SCN: NEGATIVE ng/mL
Opiate Scrn, Ur: NEGATIVE ng/mL
PH UR, DRUG SCRN: 5.7 (ref 4.5–8.9)
PHENCYCLIDINE QUANTITATIVE URINE: NEGATIVE ng/mL
Propoxyphene Scrn, Ur: NEGATIVE ng/mL

## 2018-05-03 LAB — URINALYSIS, ROUTINE W REFLEX MICROSCOPIC
BILIRUBIN UA: NEGATIVE
Glucose, UA: NEGATIVE
Ketones, UA: NEGATIVE
Leukocytes, UA: NEGATIVE
Nitrite, UA: NEGATIVE
PH UA: 5.5 (ref 5.0–7.5)
RBC UA: NEGATIVE
Specific Gravity, UA: >=1.03 — AB (ref 1.005–1.030)
Urobilinogen, Ur: 1 mg/dL (ref 0.2–1.0)

## 2018-05-03 LAB — CYTOLOGY - PAP
ADEQUACY: ABSENT
Chlamydia: NEGATIVE
Diagnosis: NEGATIVE
HPV: NOT DETECTED
Neisseria Gonorrhea: NEGATIVE

## 2018-05-03 LAB — AB SCR+ANTIBODY ID: Antibody Screen: POSITIVE — AB

## 2018-05-04 LAB — URINE CULTURE: Organism ID, Bacteria: NO GROWTH

## 2018-05-23 ENCOUNTER — Other Ambulatory Visit: Payer: 59

## 2018-05-23 ENCOUNTER — Encounter: Payer: 59 | Admitting: Women's Health

## 2018-05-25 ENCOUNTER — Ambulatory Visit (INDEPENDENT_AMBULATORY_CARE_PROVIDER_SITE_OTHER): Payer: 59 | Admitting: Obstetrics and Gynecology

## 2018-05-25 ENCOUNTER — Ambulatory Visit (INDEPENDENT_AMBULATORY_CARE_PROVIDER_SITE_OTHER): Payer: 59

## 2018-05-25 ENCOUNTER — Encounter: Payer: Self-pay | Admitting: Obstetrics and Gynecology

## 2018-05-25 VITALS — BP 126/79 | HR 91 | Wt 292.0 lb

## 2018-05-25 DIAGNOSIS — Q5128 Other doubling of uterus, other specified: Secondary | ICD-10-CM

## 2018-05-25 DIAGNOSIS — Q512 Other doubling of uterus, unspecified: Secondary | ICD-10-CM

## 2018-05-25 DIAGNOSIS — Z3A13 13 weeks gestation of pregnancy: Secondary | ICD-10-CM

## 2018-05-25 DIAGNOSIS — Z3682 Encounter for antenatal screening for nuchal translucency: Secondary | ICD-10-CM

## 2018-05-25 DIAGNOSIS — O09522 Supervision of elderly multigravida, second trimester: Secondary | ICD-10-CM

## 2018-05-25 DIAGNOSIS — Z1389 Encounter for screening for other disorder: Secondary | ICD-10-CM

## 2018-05-25 DIAGNOSIS — O34219 Maternal care for unspecified type scar from previous cesarean delivery: Secondary | ICD-10-CM

## 2018-05-25 DIAGNOSIS — O26899 Other specified pregnancy related conditions, unspecified trimester: Secondary | ICD-10-CM

## 2018-05-25 DIAGNOSIS — O09529 Supervision of elderly multigravida, unspecified trimester: Secondary | ICD-10-CM | POA: Insufficient documentation

## 2018-05-25 DIAGNOSIS — Z3009 Encounter for other general counseling and advice on contraception: Secondary | ICD-10-CM | POA: Insufficient documentation

## 2018-05-25 DIAGNOSIS — O09892 Supervision of other high risk pregnancies, second trimester: Secondary | ICD-10-CM

## 2018-05-25 DIAGNOSIS — Z331 Pregnant state, incidental: Secondary | ICD-10-CM

## 2018-05-25 DIAGNOSIS — Z6791 Unspecified blood type, Rh negative: Secondary | ICD-10-CM

## 2018-05-25 DIAGNOSIS — Z3481 Encounter for supervision of other normal pregnancy, first trimester: Secondary | ICD-10-CM

## 2018-05-25 NOTE — Progress Notes (Signed)
[place] 13+2 wks,measurements c/w dates,crl 73.73 mm,fundal placenta,uterine didelphys,fetus on right side,normal ovaries bilat,NB present,NT 1.3 mm,fhr 159 bpm

## 2018-05-25 NOTE — Progress Notes (Signed)
Subjective:  Amanda Sanchez is a 37 y.o. [id number] at [redacted]w[redacted]d being seen today for ongoing prenatal care.  She is currently monitored for the following issues for this high-risk pregnancy and has Morbid obesity (HCC); Hyperlipidemia; Candidal intertrigo; History of cesarean delivery, currently pregnant; Uterus didelphys; Rh negative state in antepartum period; Supervision of normal pregnancy; and Unwanted fertility on their problem list.  Patient reports no complaints.  Contractions: Not present. Vag. Bleeding: None.  Movement: Absent. Denies leaking of fluid.   The following portions of the patient's history were reviewed and updated as appropriate: allergies, current medications, past family history, past medical history, past social history, past surgical history and problem list. Problem list updated.  Objective:   Vitals:   05/25/18 1215  BP: 126/79  Pulse: 91  Weight: 292 lb (132.5 kg)    Fetal Status:     Movement: Absent     General:  Alert, oriented and cooperative. Patient is in no acute distress.  Skin: Skin is warm and dry. No rash noted.   Cardiovascular: Normal heart rate noted  Respiratory: Normal respiratory effort, no problems with respiration noted  Abdomen: Soft, gravid, appropriate for gestational age. Pain/Pressure: Absent     Pelvic:  Cervical exam deferred        Extremities: Normal range of motion.  Edema: None  Mental Status: Normal mood and affect. Normal behavior. Normal judgment and thought content.   Urinalysis:      Assessment and Plan:  Pregnancy: [id number] at [redacted]w[redacted]d  1. Pregnant state, incidental - POC Urinalysis Dipstick OB - Integrated 1  2. Screening for genitourinary condition - POC Urinalysis Dipstick OB - Integrated 1  3. Uterus didelphys   4. History of cesarean delivery, currently pregnant For repeat at 39 weeks  5. Rh negative state in antepartum period Rhogam when indicated  6. Unwanted fertility Desires BTL Will need to sign  papers at later [facility] visit   7. AMA First trimester screen completed today  Preterm labor symptoms and general obstetric precautions including but not limited to vaginal bleeding, contractions, leaking of fluid and fetal movement were reviewed in detail with the patient. Please refer to After Visit Summary for other counseling recommendations.  Return in about 4 weeks (around 06/22/2018) for OB visit.   Hermina Staggers, MD

## 2018-05-25 NOTE — Patient Instructions (Signed)

## 2018-05-27 LAB — INTEGRATED 1
Crown Rump Length: 73.7 mm
Gest. Age on Collection Date: 13.3 weeks
Maternal Age at EDD: 37.2 yr
NUCHAL TRANSLUCENCY (NT): 1.3 mm
Number of Fetuses: 1
PAPP-A Value: 324.3 ng/mL
Weight: 292 [lb_av]

## 2018-06-16 ENCOUNTER — Other Ambulatory Visit: Payer: Self-pay | Admitting: Advanced Practice Midwife

## 2018-06-16 ENCOUNTER — Telehealth: Payer: Self-pay | Admitting: *Deleted

## 2018-06-16 MED ORDER — WRIST SPLINT/COCK-UP/RIGHT M MISC: 1 refills | Status: DC

## 2018-06-16 MED ORDER — WRIST SPLINT/COCK-UP/LEFT M MISC: 1 refills | Status: DC

## 2018-06-16 MED ORDER — FUTURO FIRM COMPRESSION HOSE MISC: 1 refills | Status: DC

## 2018-06-16 NOTE — Progress Notes (Signed)
Wrist splints and compression hose orders sent to [facility]

## 2018-06-16 NOTE — Telephone Encounter (Signed)
I did it, but there wasn't a generic for compression hose, so if pharmacy calls wanting a substitute that's fine

## 2018-06-16 NOTE — Telephone Encounter (Signed)
Pt called stating that she is having some swelling in her lower extremities and some tingling in her hands. She states that she knows this can be normal in pregnancy. No c/o headaches, visual changes. She is requesting some support hose and hand braces be sent in to her pharmacy. Advised that I would send her request to a provider and she could check with her pharmacy in the next 24 hours. Pt verbalized understanding.

## 2018-06-22 ENCOUNTER — Ambulatory Visit (INDEPENDENT_AMBULATORY_CARE_PROVIDER_SITE_OTHER): Payer: 59 | Admitting: Obstetrics & Gynecology

## 2018-06-22 ENCOUNTER — Encounter: Payer: Self-pay | Admitting: Advanced Practice Midwife

## 2018-06-22 VITALS — BP 136/76 | HR 105 | Wt 291.0 lb

## 2018-06-22 DIAGNOSIS — Z331 Pregnant state, incidental: Secondary | ICD-10-CM

## 2018-06-22 DIAGNOSIS — Z1379 Encounter for other screening for genetic and chromosomal anomalies: Secondary | ICD-10-CM

## 2018-06-22 DIAGNOSIS — Z3A17 17 weeks gestation of pregnancy: Secondary | ICD-10-CM

## 2018-06-22 DIAGNOSIS — Z3481 Encounter for supervision of other normal pregnancy, first trimester: Secondary | ICD-10-CM

## 2018-06-22 DIAGNOSIS — Z1389 Encounter for screening for other disorder: Secondary | ICD-10-CM

## 2018-06-22 DIAGNOSIS — Z3482 Encounter for supervision of other normal pregnancy, second trimester: Secondary | ICD-10-CM

## 2018-06-22 LAB — POCT URINALYSIS DIPSTICK OB
Blood, UA: NEGATIVE
Glucose, UA: NEGATIVE
KETONES UA: NEGATIVE
Leukocytes, UA: NEGATIVE
Nitrite, UA: NEGATIVE

## 2018-06-22 NOTE — Progress Notes (Signed)
   LOW-RISK PREGNANCY VISIT Patient name: Amanda Sanchez [place] MRN [id number]  Date of birth: 12/02/1980 Chief Complaint:   Routine Prenatal Visit (2nd IT)  History of Present Illness:   Amanda Sanchez Mellette is a 37 y.o. [id number] female at 698w2d with an Estimated Date of Delivery: 11/28/18 being seen today for ongoing management of a low-risk pregnancy.  Today she reports no complaints.  . Vag. Bleeding: None.  Movement: Absent. denies leaking of fluid. Review of Systems:   Pertinent items are noted in HPI Denies abnormal vaginal discharge w/ itching/odor/irritation, headaches, visual changes, shortness of breath, chest pain, abdominal pain, severe nausea/vomiting, or problems with urination or bowel movements unless otherwise stated above. Pertinent History Reviewed:  Reviewed past medical,surgical, social, obstetrical and family history.  Reviewed problem list, medications and allergies. Physical Assessment:   Vitals:   06/22/18 0954  BP: 136/76  Pulse: (!) 105  Weight: 291 lb (132 kg)  Body mass index is 49.95 kg/m.        Physical Examination:   General appearance: Well appearing, and in no distress  Mental status: Alert, oriented to person, place, and time  Skin: Warm & dry  Cardiovascular: Normal heart rate noted  Respiratory: Normal respiratory effort, no distress  Abdomen: Soft, gravid, nontender  Pelvic: Cervical exam deferred         Extremities: Edema: Trace  Fetal Status:     Movement: Absent    Results for orders placed or performed in visit on 06/22/18 (from the past 24 hour(s))  POC Urinalysis Dipstick OB   Collection Time: 06/22/18  9:54 AM  Result Value Ref Range   Color, UA     Clarity, UA     Glucose, UA Negative Negative   Bilirubin, UA     Ketones, UA neg    Spec Grav, UA     Blood, UA neg    pH, UA     POC,PROTEIN,UA Trace Negative, Trace, Small (1+), Moderate (2+), Large (3+), 4+   Urobilinogen, UA     Nitrite, UA neg    Leukocytes, UA Negative Negative   Appearance     Odor      Assessment & Plan:  1) Low-risk pregnancy [id number] at 228w2d with an Estimated Date of Delivery: 11/28/18   2) uterine didelphys  3)  Previous C section x 2  4) Rh negative   Meds: No orders of the defined types were placed in this encounter.  Labs/procedures today:   Plan:  Continue routine obstetrical care   Reviewed: Preterm labor symptoms and general obstetric precautions including but not limited to vaginal bleeding, contractions, leaking of fluid and fetal movement were reviewed in detail with the patient.  All questions were answered  Follow-up: Return in about 3 weeks (around 07/13/2018) for 20 week sono, LROB.  Orders Placed This Encounter  Procedures  . US OB Comp + 14 Wk  . INTEGRATED 2  . POC Urinalysis Dipstick OB   Lazaro ArmsLuther H Rakhi Romagnoli CNM, WHNP-BC 06/22/2018 10:10 AM

## 2018-06-25 LAB — INTEGRATED 2
AFP MoM: 2.77
Alpha-Fetoprotein: 61.4 ng/mL
Crown Rump Length: 73.7 mm
DIA MoM: 0.96
DIA Value: 109.5 pg/mL
Estriol, Unconjugated: 1.4 ng/mL
Gest. Age on Collection Date: 13.3 weeks
Gestational Age: 17.3 weeks
Maternal Age at EDD: 37.2 yr
NUCHAL TRANSLUCENCY (NT): 1.3 mm
NUCHAL TRANSLUCENCY MOM: 0.77
Number of Fetuses: 1
PAPP-A MOM: 0.6
PAPP-A Value: 324.3 ng/mL
Test Results:: NEGATIVE
WEIGHT: 292 [lb_av]
Weight: 291 [lb_av]
hCG MoM: 1.27
hCG Value: 20.4 IU/mL
uE3 MoM: 1.54

## 2018-07-04 ENCOUNTER — Other Ambulatory Visit: Payer: Self-pay | Admitting: Obstetrics & Gynecology

## 2018-07-04 DIAGNOSIS — Z363 Encounter for antenatal screening for malformations: Secondary | ICD-10-CM

## 2018-07-04 DIAGNOSIS — Z3481 Encounter for supervision of other normal pregnancy, first trimester: Secondary | ICD-10-CM

## 2018-07-05 ENCOUNTER — Ambulatory Visit (INDEPENDENT_AMBULATORY_CARE_PROVIDER_SITE_OTHER): Payer: 59

## 2018-07-05 DIAGNOSIS — Z3481 Encounter for supervision of other normal pregnancy, first trimester: Secondary | ICD-10-CM

## 2018-07-05 DIAGNOSIS — Z363 Encounter for antenatal screening for malformations: Secondary | ICD-10-CM | POA: Diagnosis not present

## 2018-07-05 NOTE — Progress Notes (Signed)
US 19+1,uterine didelphys,fetus on right transverse,cx length right 3.8 cm,anterior fundal placenta gr 0,normal left ovary,right ovary not visualized,svp of fluid 5.4 cm,fhr 159 bpm,efw 340 g 94 %,anatomy complete,limited view because of pt body habitus

## 2018-07-07 ENCOUNTER — Encounter: Payer: 59 | Admitting: Advanced Practice Midwife

## 2018-07-13 ENCOUNTER — Other Ambulatory Visit: Payer: 59

## 2018-07-13 ENCOUNTER — Encounter: Payer: 59 | Admitting: Advanced Practice Midwife

## 2018-07-26 ENCOUNTER — Other Ambulatory Visit: Payer: 59

## 2018-07-26 ENCOUNTER — Telehealth: Payer: Self-pay | Admitting: *Deleted

## 2018-07-26 ENCOUNTER — Encounter: Payer: 59 | Admitting: Advanced Practice Midwife

## 2018-07-26 NOTE — Telephone Encounter (Signed)
Patient states she is having dizziness when when looks up and down while working. She has started a new job and moves a lot.  Patient states she is not drinking as much water as she should. Encouraged patient to push fluids as she could be dehydrated and to avoid sudden movements to help reduce the dizziness.  Also encouraged to eat small frequent snacks.  Verbalized understanding.

## 2018-07-28 ENCOUNTER — Encounter: Payer: Self-pay | Admitting: Advanced Practice Midwife

## 2018-08-04 ENCOUNTER — Ambulatory Visit (INDEPENDENT_AMBULATORY_CARE_PROVIDER_SITE_OTHER): Payer: Medicaid Other | Admitting: Obstetrics & Gynecology

## 2018-08-04 ENCOUNTER — Other Ambulatory Visit: Payer: Self-pay

## 2018-08-04 ENCOUNTER — Telehealth: Payer: Self-pay | Admitting: *Deleted

## 2018-08-04 ENCOUNTER — Encounter: Payer: Self-pay | Admitting: Obstetrics & Gynecology

## 2018-08-04 VITALS — BP 124/70 | HR 103 | Wt 297.0 lb

## 2018-08-04 DIAGNOSIS — Z1389 Encounter for screening for other disorder: Secondary | ICD-10-CM

## 2018-08-04 DIAGNOSIS — R42 Dizziness and giddiness: Secondary | ICD-10-CM

## 2018-08-04 DIAGNOSIS — Z3482 Encounter for supervision of other normal pregnancy, second trimester: Secondary | ICD-10-CM | POA: Diagnosis not present

## 2018-08-04 DIAGNOSIS — Z331 Pregnant state, incidental: Secondary | ICD-10-CM

## 2018-08-04 DIAGNOSIS — Z3A23 23 weeks gestation of pregnancy: Secondary | ICD-10-CM

## 2018-08-04 LAB — POCT URINALYSIS DIPSTICK OB
Blood, UA: NEGATIVE
GLUCOSE, UA: NEGATIVE
Leukocytes, UA: NEGATIVE
Nitrite, UA: NEGATIVE

## 2018-08-04 LAB — POCT HEMOGLOBIN: HEMOGLOBIN: 10.9 g/dL — AB (ref 11–14.6)

## 2018-08-04 NOTE — Progress Notes (Signed)
   LOW-RISK PREGNANCY VISIT Patient name: Amanda Sanchez MRN [id number]  Date of birth: Sep 17, 1980 Chief Complaint:   Routine Prenatal Visit  History of Present Illness:   Amanda Sanchez is a 38 y.o. [id number] female at [redacted]w[redacted]d with an Estimated Date of Delivery: 11/28/18 being seen today for ongoing management of a low-risk pregnancy.  Today she reports no complaints. Contractions: Not present. Vag. Bleeding: None.  Movement: Present. denies leaking of fluid. Review of Systems:   Pertinent items are noted in HPI Denies abnormal vaginal discharge w/ itching/odor/irritation, headaches, visual changes, shortness of breath, chest pain, abdominal pain, severe nausea/vomiting, or problems with urination or bowel movements unless otherwise stated above. Pertinent History Reviewed:  Reviewed past medical,surgical, social, obstetrical and family history.  Reviewed problem list, medications and allergies. Physical Assessment:   Vitals:   08/04/18 1610  BP: 124/70  Pulse: (!) 103  Weight: 297 lb (134.7 kg)  Body mass index is 50.98 kg/m.        Physical Examination:   General appearance: Well appearing, and in no distress  Mental status: Alert, oriented to person, place, and time  Skin: Warm & dry  Cardiovascular: Normal heart rate noted  Respiratory: Normal respiratory effort, no distress  Abdomen: Soft, gravid, nontender  Pelvic: Cervical exam deferred         Extremities: Edema: Trace  Fetal Status:     Movement: Present    No results found for this or any previous visit (from the past 24 hour(s)).  Assessment & Plan:  1) Low-risk pregnancy [id number] at [redacted]w[redacted]d with an Estimated Date of Delivery: 11/28/18      Meds: No orders of the defined types were placed in this encounter.  Labs/procedures today:   Plan:  Continue routine obstetrical care   Reviewed: Preterm labor symptoms and general obstetric precautions including but not limited to vaginal bleeding, contractions, leaking of  fluid and fetal movement were reviewed in detail with the patient.  All questions were answered  Follow-up: Return in about 4 weeks (around 09/01/2018) for PN2, , LROB.  Orders Placed This Encounter  Procedures  . Compression stockings  . POC Urinalysis Dipstick OB  . POCT hemoglobin   Lazaro Arms  08/21/2018 8:38 PM

## 2018-08-04 NOTE — Telephone Encounter (Signed)
Attempted to return pts call; line busy and other number had voicemail not set up.

## 2018-08-09 ENCOUNTER — Telehealth: Payer: Self-pay | Admitting: *Deleted

## 2018-08-09 NOTE — Telephone Encounter (Signed)
Pt is calling back about script for stockings/ [facility] needs a DX and they said they have called here and cant get [place]

## 2018-08-09 NOTE — Telephone Encounter (Signed)
Spoke to pharmacy who states diagnosis code has already been provided.  Nothing else needed at this time.

## 2018-08-29 ENCOUNTER — Encounter: Payer: Self-pay | Admitting: *Deleted

## 2018-09-02 ENCOUNTER — Encounter: Payer: Medicaid Other | Admitting: Women's Health

## 2018-09-02 ENCOUNTER — Other Ambulatory Visit: Payer: Medicaid Other

## 2018-09-06 ENCOUNTER — Encounter: Payer: Self-pay | Admitting: Women's Health

## 2018-09-06 ENCOUNTER — Ambulatory Visit (INDEPENDENT_AMBULATORY_CARE_PROVIDER_SITE_OTHER): Payer: Medicaid Other | Admitting: Women's Health

## 2018-09-06 ENCOUNTER — Other Ambulatory Visit: Payer: Medicaid Other

## 2018-09-06 VITALS — BP 131/83 | HR 95 | Wt 289.0 lb

## 2018-09-06 DIAGNOSIS — N9089 Other specified noninflammatory disorders of vulva and perineum: Secondary | ICD-10-CM

## 2018-09-06 DIAGNOSIS — IMO0001 Reserved for inherently not codable concepts without codable children: Secondary | ICD-10-CM

## 2018-09-06 DIAGNOSIS — O26843 Uterine size-date discrepancy, third trimester: Secondary | ICD-10-CM

## 2018-09-06 DIAGNOSIS — Z3483 Encounter for supervision of other normal pregnancy, third trimester: Secondary | ICD-10-CM

## 2018-09-06 DIAGNOSIS — Z23 Encounter for immunization: Secondary | ICD-10-CM

## 2018-09-06 DIAGNOSIS — Z1389 Encounter for screening for other disorder: Secondary | ICD-10-CM

## 2018-09-06 DIAGNOSIS — Z331 Pregnant state, incidental: Secondary | ICD-10-CM

## 2018-09-06 DIAGNOSIS — Z3A28 28 weeks gestation of pregnancy: Secondary | ICD-10-CM

## 2018-09-06 DIAGNOSIS — O3663X0 Maternal care for excessive fetal growth, third trimester, not applicable or unspecified: Secondary | ICD-10-CM

## 2018-09-06 LAB — POCT URINALYSIS DIPSTICK OB
GLUCOSE, UA: NEGATIVE
Leukocytes, UA: NEGATIVE
Nitrite, UA: NEGATIVE
RBC UA: NEGATIVE

## 2018-09-06 NOTE — Progress Notes (Signed)
   LOW-RISK PREGNANCY VISIT Patient name: Amanda Sanchez MRN [id number]  Date of birth: August 13, 1980 Chief Complaint:   Routine Prenatal Visit (PN2/ tdap) and vaginal irration  History of Present Illness:   Amanda Sanchez is a 38 y.o. [id number] female at [redacted]w[redacted]d with an Estimated Date of Delivery: 11/28/18 being seen today for ongoing management of a low-risk pregnancy.  Today she reports itching/irritation started in crease of upper leg, moved to vulvar area, used some hydrocortisone- made it worse.Wants BTL. Contractions: Not present.  .  Movement: Present. denies leaking of fluid. Review of Systems:   Pertinent items are noted in HPI Denies abnormal vaginal discharge w/ itching/odor/irritation, headaches, visual changes, shortness of breath, chest pain, abdominal pain, severe nausea/vomiting, or problems with urination or bowel movements unless otherwise stated above. Pertinent History Reviewed:  Reviewed past medical,surgical, social, obstetrical and family history.  Reviewed problem list, medications and allergies. Physical Assessment:   Vitals:   09/06/18 0928  BP: 131/83  Pulse: 95  Weight: 289 lb (131.1 kg)  Body mass index is 49.61 kg/m.        Physical Examination:   General appearance: Well appearing, and in no distress  Mental status: Alert, oriented to person, place, and time  Skin: Warm & dry  Cardiovascular: Normal heart rate noted  Respiratory: Normal respiratory effort, no distress  Abdomen: Soft, gravid, nontender  Pelvic: spec exam: cx visually long/closed, scant d/c nonodorous, painted vulva/skin creases w/ gentian violet         Extremities: Edema: None  Fetal Status: Fetal Heart Rate (bpm): 150 Fundal Height: 37 cm Movement: Present    Results for orders placed or performed in visit on 09/06/18 (from the past 24 hour(s))  POC Urinalysis Dipstick OB   Collection Time: 09/06/18  9:32 AM  Result Value Ref Range   Color, UA     Clarity, UA     Glucose, UA  Negative Negative   Bilirubin, UA     Ketones, UA moderate    Spec Grav, UA     Blood, UA neg    pH, UA     POC,PROTEIN,UA Trace Negative, Trace, Small (1+), Moderate (2+), Large (3+), 4+   Urobilinogen, UA     Nitrite, UA neg    Leukocytes, UA Negative Negative   Appearance     Odor      Assessment & Plan:  1) Low-risk pregnancy [id number] at [redacted]w[redacted]d with an Estimated Date of Delivery: 11/28/18   2) Vuvlovaginal candida, painted vulva/skin creases w/ gentian violet, otc monistat 7, no sex while using  3) Prev c/s> for repeat w/ BTL, reviewed risks/benefits, consent signed today  4) LGA @ anatomy u/s- will get efw/afi next visit   Meds: No orders of the defined types were placed in this encounter.  Labs/procedures today: pn2, spec exam, wet prep, tdap  Plan:  Continue routine obstetrical care   Reviewed: Preterm labor symptoms and general obstetric precautions including but not limited to vaginal bleeding, contractions, leaking of fluid and fetal movement were reviewed in detail with the patient.  All questions were answered  Follow-up: Return in about 4 weeks (around 10/04/2018) for LROB, Sign BTL consent today, US:EFW.  Orders Placed This Encounter  Procedures  . US OB Follow Up  . Tdap vaccine greater than or equal to 7yo IM  . POC Urinalysis Dipstick OB  . POCT Wet Prep [facility])   Cheral Marker CNM, [facility] 09/06/2018 10:58 AM

## 2018-09-06 NOTE — Patient Instructions (Signed)
Amanda Sanchez, I greatly value your feedback.  If you receive a survey following your visit with [place] today, we appreciate you taking the time to fill it out.  Thanks, Joellyn Haff, CNM, WHNP-BC   Call the office [phone]) or go to [facility] if:  You begin to have strong, frequent contractions  Your water breaks.  Sometimes it is a big gush of fluid, sometimes it is just a trickle that keeps getting your panties wet or running down your legs  You have vaginal bleeding.  It is normal to have a small amount of spotting if your cervix was checked.   You don't feel your baby moving like normal.  If you don't, get you something to eat and drink and lay down and focus on feeling your baby move.  You should feel at least 10 movements in 2 hours.  If you don't, you should call the office or go to [facility].    Tdap Vaccine  It is recommended that you get the Tdap vaccine during the third trimester of EACH pregnancy to help protect your baby from getting pertussis (whooping cough)  27-36 weeks is the BEST time to do this so that you can pass the protection on to your baby. During pregnancy is better than after pregnancy, but if you are unable to get it during pregnancy it will be offered at the hospital.   You can get this vaccine with [place], at the health department, your family doctor, or some local pharmacies  Everyone who will be around your baby should also be up-to-date on their vaccines before the baby comes. Adults (who are not pregnant) only need 1 dose of Tdap during adulthood.   Third Trimester of Pregnancy The third trimester is from week 29 through week 42, months 7 through 9. The third trimester is a time when the fetus is growing rapidly. At the end of the ninth month, the fetus is about 20 inches in length and weighs 6-10 pounds.  BODY CHANGES Your body goes through many changes during pregnancy. The changes vary from woman to woman.   Your weight will continue to  increase. You can expect to gain 25-35 pounds (11-16 kg) by the end of the pregnancy.  You may begin to get stretch marks on your hips, abdomen, and breasts.  You may urinate more often because the fetus is moving lower into your pelvis and pressing on your bladder.  You may develop or continue to have heartburn as a result of your pregnancy.  You may develop constipation because certain hormones are causing the muscles that push waste through your intestines to slow down.  You may develop hemorrhoids or swollen, bulging veins (varicose veins).  You may have pelvic pain because of the weight gain and pregnancy hormones relaxing your joints between the bones in your pelvis. Backaches may result from overexertion of the muscles supporting your posture.  You may have changes in your hair. These can include thickening of your hair, rapid growth, and changes in texture. Some women also have hair loss during or after pregnancy, or hair that feels dry or thin. Your hair will most likely return to normal after your baby is born.  Your breasts will continue to grow and be tender. A yellow discharge may leak from your breasts called colostrum.  Your belly button may stick out.  You may feel short of breath because of your expanding uterus.  You may notice the fetus "dropping," or moving lower  in your abdomen.  You may have a bloody mucus discharge. This usually occurs a few days to a week before labor begins.  Your cervix becomes thin and soft (effaced) near your due date. WHAT TO EXPECT AT YOUR PRENATAL EXAMS  You will have prenatal exams every 2 weeks until week 36. Then, you will have weekly prenatal exams. During a routine prenatal visit:  You will be weighed to make sure you and the fetus are growing normally.  Your blood pressure is taken.  Your abdomen will be measured to track your baby's growth.  The fetal heartbeat will be listened to.  Any test results from the previous visit  will be discussed.  You may have a cervical check near your due date to see if you have effaced. At around 36 weeks, your caregiver will check your cervix. At the same time, your caregiver will also perform a test on the secretions of the vaginal tissue. This test is to determine if a type of bacteria, Group B streptococcus, is present. Your caregiver will explain this further. Your caregiver may ask you:  What your birth plan is.  How you are feeling.  If you are feeling the baby move.  If you have had any abnormal symptoms, such as leaking fluid, bleeding, severe headaches, or abdominal cramping.  If you have any questions. Other tests or screenings that may be performed during your third trimester include:  Blood tests that check for low iron levels (anemia).  Fetal testing to check the health, activity level, and growth of the fetus. Testing is done if you have certain medical conditions or if there are problems during the pregnancy. FALSE LABOR You may feel small, irregular contractions that eventually go away. These are called Braxton Hicks contractions, or false labor. Contractions may last for hours, days, or even weeks before true labor sets in. If contractions come at regular intervals, intensify, or become painful, it is best to be seen by your caregiver.  SIGNS OF LABOR   Menstrual-like cramps.  Contractions that are 5 minutes apart or less.  Contractions that start on the top of the uterus and spread down to the lower abdomen and back.  A sense of increased pelvic pressure or back pain.  A watery or bloody mucus discharge that comes from the vagina. If you have any of these signs before the 37th week of pregnancy, call your caregiver right away. You need to go to the hospital to get checked immediately. HOME CARE INSTRUCTIONS   Avoid all smoking, herbs, alcohol, and unprescribed drugs. These chemicals affect the formation and growth of the baby.  Follow your  caregiver's instructions regarding medicine use. There are medicines that are either safe or unsafe to take during pregnancy.  Exercise only as directed by your caregiver. Experiencing uterine cramps is a good sign to stop exercising.  Continue to eat regular, healthy meals.  Wear a good support bra for breast tenderness.  Do not use hot tubs, steam rooms, or saunas.  Wear your seat belt at all times when driving.  Avoid raw meat, uncooked cheese, cat litter boxes, and soil used by cats. These carry germs that can cause birth defects in the baby.  Take your prenatal vitamins.  Try taking a stool softener (if your caregiver approves) if you develop constipation. Eat more high-fiber foods, such as fresh vegetables or fruit and whole grains. Drink plenty of fluids to keep your urine clear or pale yellow.  Take warm sitz baths   to soothe any pain or discomfort caused by hemorrhoids. Use hemorrhoid cream if your caregiver approves.  If you develop varicose veins, wear support hose. Elevate your feet for 15 minutes, 3-4 times a day. Limit salt in your diet.  Avoid heavy lifting, wear low heal shoes, and practice good posture.  Rest a lot with your legs elevated if you have leg cramps or low back pain.  Visit your dentist if you have not gone during your pregnancy. Use a soft toothbrush to brush your teeth and be gentle when you floss.  A sexual relationship may be continued unless your caregiver directs you otherwise.  Do not travel far distances unless it is absolutely necessary and only with the approval of your caregiver.  Take prenatal classes to understand, practice, and ask questions about the labor and delivery.  Make a trial run to the hospital.  Pack your hospital bag.  Prepare the baby's nursery.  Continue to go to all your prenatal visits as directed by your caregiver. SEEK MEDICAL CARE IF:  You are unsure if you are in labor or if your water has broken.  You have  dizziness.  You have mild pelvic cramps, pelvic pressure, or nagging pain in your abdominal area.  You have persistent nausea, vomiting, or diarrhea.  You have a bad smelling vaginal discharge.  You have pain with urination. SEEK IMMEDIATE MEDICAL CARE IF:   You have a fever.  You are leaking fluid from your vagina.  You have spotting or bleeding from your vagina.  You have severe abdominal cramping or pain.  You have rapid weight loss or gain.  You have shortness of breath with chest pain.  You notice sudden or extreme swelling of your face, hands, ankles, feet, or legs.  You have not felt your baby move in over an hour.  You have severe headaches that do not go away with medicine.  You have vision changes. Document Released: 07/07/2001 Document Revised: 07/18/2013 Document Reviewed: 09/13/2012 [facility] Patient Information 2015 [place], [place]. This information is not intended to replace advice given to you by your health care provider. Make sure you discuss any questions you have with your health care provider.

## 2018-09-07 ENCOUNTER — Encounter: Payer: Self-pay | Admitting: Obstetrics & Gynecology

## 2018-09-07 ENCOUNTER — Encounter: Payer: Self-pay | Admitting: Advanced Practice Midwife

## 2018-09-08 LAB — CBC
Hematocrit: 31.5 % — ABNORMAL LOW (ref 34.0–46.6)
Hemoglobin: 10.8 g/dL — ABNORMAL LOW (ref 11.1–15.9)
MCH: 27 pg (ref 26.6–33.0)
MCHC: 34.3 g/dL (ref 31.5–35.7)
MCV: 79 fL (ref 79–97)
PLATELETS: 265 10*3/uL (ref 150–450)
RBC: 4 x10E6/uL (ref 3.77–5.28)
RDW: 12 % (ref 11.7–15.4)
WBC: 10.4 10*3/uL (ref 3.4–10.8)

## 2018-09-08 LAB — GLUCOSE TOLERANCE, 2 HOURS W/ 1HR
GLUCOSE, 2 HOUR: 99 mg/dL (ref 65–152)
Glucose, 1 hour: 111 mg/dL (ref 65–179)
Glucose, Fasting: 82 mg/dL (ref 65–91)

## 2018-09-08 LAB — HIV ANTIBODY (ROUTINE TESTING W REFLEX): HIV Screen 4th Generation wRfx: NONREACTIVE

## 2018-09-08 LAB — ANTIBODY SCREEN

## 2018-09-08 LAB — AB SCR+ANTIBODY ID
Antibody Screen: POSITIVE — AB
Coombs Titer #1: 32

## 2018-09-08 LAB — RPR: RPR Ser Ql: NONREACTIVE

## 2018-09-09 ENCOUNTER — Other Ambulatory Visit: Payer: Self-pay | Admitting: Women's Health

## 2018-09-09 DIAGNOSIS — O3662X1 Maternal care for excessive fetal growth, second trimester, fetus 1: Secondary | ICD-10-CM

## 2018-09-12 ENCOUNTER — Ambulatory Visit (INDEPENDENT_AMBULATORY_CARE_PROVIDER_SITE_OTHER): Payer: Medicaid Other

## 2018-09-12 DIAGNOSIS — O3663X0 Maternal care for excessive fetal growth, third trimester, not applicable or unspecified: Secondary | ICD-10-CM

## 2018-09-12 DIAGNOSIS — Z3483 Encounter for supervision of other normal pregnancy, third trimester: Secondary | ICD-10-CM

## 2018-09-12 DIAGNOSIS — O26843 Uterine size-date discrepancy, third trimester: Secondary | ICD-10-CM

## 2018-09-12 NOTE — Progress Notes (Signed)
[place] 29 wks,transverse head right,cx 5.6 cm,uterine didelphys,fetus on right,normal ovaries bilat,afi 15 cm,anterior placenta gr 0,fhr 152 bpm,EFW 1666 g 94%,limited view because of pt body habitus

## 2018-09-13 ENCOUNTER — Encounter: Payer: Medicaid Other | Admitting: Obstetrics & Gynecology

## 2018-09-14 ENCOUNTER — Telehealth: Payer: Self-pay | Admitting: Advanced Practice Midwife

## 2018-09-14 NOTE — Telephone Encounter (Signed)
Labs results done last week would like a call with results.

## 2018-09-14 NOTE — Telephone Encounter (Signed)
Patient wanted results of glucose test.  Informed normal results.  Appt tomorrow with Dr Despina Hidden to discuss u/s and can answer any other questions she has.

## 2018-09-15 ENCOUNTER — Ambulatory Visit (INDEPENDENT_AMBULATORY_CARE_PROVIDER_SITE_OTHER): Payer: Medicaid Other | Admitting: Obstetrics & Gynecology

## 2018-09-15 VITALS — BP 131/85 | HR 80 | Wt 292.0 lb

## 2018-09-15 DIAGNOSIS — O0993 Supervision of high risk pregnancy, unspecified, third trimester: Secondary | ICD-10-CM

## 2018-09-15 DIAGNOSIS — Z3A29 29 weeks gestation of pregnancy: Secondary | ICD-10-CM | POA: Diagnosis not present

## 2018-09-15 DIAGNOSIS — O360931 Maternal care for other rhesus isoimmunization, third trimester, fetus 1: Secondary | ICD-10-CM | POA: Diagnosis not present

## 2018-09-15 DIAGNOSIS — O34219 Maternal care for unspecified type scar from previous cesarean delivery: Secondary | ICD-10-CM | POA: Diagnosis not present

## 2018-09-15 DIAGNOSIS — Q5128 Other doubling of uterus, other specified: Secondary | ICD-10-CM

## 2018-09-15 DIAGNOSIS — Z331 Pregnant state, incidental: Secondary | ICD-10-CM

## 2018-09-15 DIAGNOSIS — Q512 Other doubling of uterus, unspecified: Secondary | ICD-10-CM

## 2018-09-15 DIAGNOSIS — Z1389 Encounter for screening for other disorder: Secondary | ICD-10-CM

## 2018-09-15 LAB — POCT URINALYSIS DIPSTICK OB
Blood, UA: NEGATIVE
Glucose, UA: NEGATIVE
Ketones, UA: NEGATIVE
Leukocytes, UA: NEGATIVE
NITRITE UA: NEGATIVE
POC,PROTEIN,UA: NEGATIVE

## 2018-09-15 NOTE — Progress Notes (Signed)
HIGH-RISK PREGNANCY VISIT Patient name: Amanda Sanchez MRN [id number]  Date of birth: 1981/07/23 Chief Complaint:   Routine Prenatal Visit  History of Present Illness:   Amanda Sanchez is a 38 y.o. 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  Today she reports no complaints. Contractions: Not present. Vag. Bleeding: None.  Movement: Present. denies leaking of fluid.  Review of Systems:   Pertinent items are noted in HPI Denies abnormal vaginal discharge w/ itching/odor/irritation, headaches, visual changes, shortness of breath, chest pain, abdominal pain, severe nausea/vomiting, or problems with urination or bowel movements unless otherwise stated above. Pertinent History Reviewed:  Reviewed past medical,surgical, social, obstetrical and family history.  Reviewed problem list, medications and allergies. Physical Assessment:   Vitals:   09/15/18 0932  BP: 131/85  Pulse: 80  Weight: 292 lb (132.5 kg)  Body mass index is 50.12 kg/m.           Physical Examination:   General appearance: alert, well appearing, and in no distress  Mental status: alert, oriented to person, place, and time  Skin: warm & dry   Extremities: Edema: Trace    Cardiovascular: normal heart rate noted  Respiratory: normal respiratory effort, no distress  Abdomen: gravid, soft, non-tender  Pelvic: Cervical exam deferred         Fetal Status: Fetal Heart Rate (bpm): 153 Fundal Height: 37 cm Movement: Present    Fetal Surveillance Testing today: FHR 153   Results for orders placed or performed in visit on 09/15/18 (from the past 24 hour(s))  POC Urinalysis Dipstick OB   Collection Time: 09/15/18  9:35 AM  Result Value Ref Range   Color, UA     Clarity, UA     Glucose, UA Negative Negative   Bilirubin,  UA     Ketones, UA neg    Spec Grav, UA     Blood, UA neg    pH, UA     POC,PROTEIN,UA Negative Negative, Trace, Small (1+), Moderate (2+), Large (3+), 4+   Urobilinogen, UA     Nitrite, UA neg    Leukocytes, UA Negative Negative   Appearance     Odor      Assessment & Plan:  1) High-risk pregnancy [id number] at [redacted]w[redacted]d with an Estimated Date of Delivery: 11/28/18   2) Rh isoimmunization, cannot find that pt received her RhoGam after her last delivery, she had passive low titer anit D and it appears as a result didi not receive RhoGam, pt inadvertantly received RhoGam today which of course is not a problem but could artificailly make titer greater, will be performing sonogram in 2-3 weeks again to evaluate for presence of fetal changes/hydropic or otherwise which would be a sign of fetal anemia  3) Previous C section x 2, for repeat, desires BTL as well,   Meds: No orders of the defined types were placed in this encounter.   Labs/procedures today:   Treatment Plan:  See above  Reviewed: Preterm labor symptoms and general obstetric precautions including but not limited to vaginal bleeding, contractions, leaking of fluid and fetal movement were reviewed in detail with the patient.  All questions were answered.  Follow-up: Return in about 2 weeks (around 09/29/2018) for HROB, with Dr Despina Hidden.  Orders Placed This Encounter  Procedures  . RHO (D) Immune Globulin  .  POC Urinalysis Dipstick OB   Lazaro Arms 09/15/2018 9:57 AM

## 2018-09-16 ENCOUNTER — Telehealth: Payer: Self-pay | Admitting: *Deleted

## 2018-09-16 ENCOUNTER — Telehealth: Payer: Self-pay | Admitting: Obstetrics & Gynecology

## 2018-09-16 MED ORDER — FLUCONAZOLE 150 MG PO TABS: 150.0000 mg | ORAL_TABLET | Freq: Once | ORAL | 0 refills | Status: AC

## 2018-09-16 NOTE — Telephone Encounter (Signed)
Sent diflucan script to [facility]

## 2018-09-16 NOTE — Telephone Encounter (Signed)
Pt using monistat 7 as directed by Drenda Freeze. She states that the cream burns too bad and requests a pill to be sent in. Advised that I would send her request to a provider and she could check with her pharmacy later today.   States baby is moving, denies LOF, vaginal bleeding and contractions

## 2018-09-16 NOTE — Telephone Encounter (Signed)
Meds ordered this encounter  Medications  . fluconazole (DIFLUCAN) 150 MG tablet    Sig: Take 1 tablet (150 mg total) by mouth once for 1 dose. Take the second tablet 3 days after the first one.    Dispense:  2 tablet    Refill:  0    

## 2018-09-23 ENCOUNTER — Other Ambulatory Visit: Payer: Self-pay | Admitting: Obstetrics & Gynecology

## 2018-09-23 DIAGNOSIS — O360921 Maternal care for other rhesus isoimmunization, second trimester, fetus 1: Secondary | ICD-10-CM

## 2018-09-23 DIAGNOSIS — O360931 Maternal care for other rhesus isoimmunization, third trimester, fetus 1: Secondary | ICD-10-CM

## 2018-09-23 DIAGNOSIS — O09523 Supervision of elderly multigravida, third trimester: Secondary | ICD-10-CM

## 2018-09-27 ENCOUNTER — Ambulatory Visit (INDEPENDENT_AMBULATORY_CARE_PROVIDER_SITE_OTHER): Payer: Medicaid Other | Admitting: Obstetrics & Gynecology

## 2018-09-27 ENCOUNTER — Encounter: Payer: Self-pay | Admitting: Obstetrics & Gynecology

## 2018-09-27 VITALS — BP 133/87 | HR 90 | Wt 299.0 lb

## 2018-09-27 DIAGNOSIS — O360930 Maternal care for other rhesus isoimmunization, third trimester, not applicable or unspecified: Secondary | ICD-10-CM

## 2018-09-27 DIAGNOSIS — Z1389 Encounter for screening for other disorder: Secondary | ICD-10-CM

## 2018-09-27 DIAGNOSIS — Z331 Pregnant state, incidental: Secondary | ICD-10-CM

## 2018-09-27 DIAGNOSIS — Z3A31 31 weeks gestation of pregnancy: Secondary | ICD-10-CM

## 2018-09-27 DIAGNOSIS — O360931 Maternal care for other rhesus isoimmunization, third trimester, fetus 1: Secondary | ICD-10-CM

## 2018-09-27 DIAGNOSIS — O0993 Supervision of high risk pregnancy, unspecified, third trimester: Secondary | ICD-10-CM

## 2018-09-27 LAB — POCT URINALYSIS DIPSTICK OB
Blood, UA: NEGATIVE
Glucose, UA: NEGATIVE
Ketones, UA: NEGATIVE
Leukocytes, UA: NEGATIVE
Nitrite, UA: NEGATIVE
POC,PROTEIN,UA: NEGATIVE

## 2018-09-27 NOTE — Progress Notes (Signed)
   HIGH-RISK PREGNANCY VISIT Patient name: Amanda Sanchez MRN [id number]  Date of birth: [birth year] Chief Complaint:   High Risk Gestation  History of Present Illness:   Amanda Sanchez is a 38 y.o. [id number] female at [redacted]w[redacted]d with an Estimated Date of Delivery: 11/28/18 being seen today for ongoing management of a high-risk pregnancy complicated by Rh isoimmunization.  Today she reports no complaints. Contractions: Not present. Vag. Bleeding: None.  Movement: Present. denies leaking of fluid.  Review of Systems:   Pertinent items are noted in HPI Denies abnormal vaginal discharge w/ itching/odor/irritation, headaches, visual changes, shortness of breath, chest pain, abdominal pain, severe nausea/vomiting, or problems with urination or bowel movements unless otherwise stated above. Pertinent History Reviewed:  Reviewed past medical,surgical, social, obstetrical and family history.  Reviewed problem list, medications and allergies. Physical Assessment:   Vitals:   09/27/18 1107  BP: 133/87  Pulse: 90  Weight: 299 lb (135.6 kg)  Body mass index is 51.32 kg/m.           Physical Examination:   General appearance: alert, well appearing, and in no distress  Mental status: alert, oriented to person, place, and time  Skin: warm & dry   Extremities: Edema: Trace    Cardiovascular: normal heart rate noted  Respiratory: normal respiratory effort, no distress  Abdomen: gravid, soft, non-tender  Pelvic: Cervical exam deferred         Fetal Status: Fetal Heart Rate (bpm): 138 Fundal Height: 39 cm Movement: Present    Fetal Surveillance Testing today: FHR 138   Results for orders placed or performed in visit on 09/27/18 (from the past 24 hour(s))  POC Urinalysis Dipstick OB   Collection Time: 09/27/18 11:08 AM  Result Value Ref Range   Color, UA     Clarity, UA     Glucose, UA Negative Negative   Bilirubin, UA     Ketones, UA neg    Spec Grav, UA     Blood, UA neg    pH, UA     POC,PROTEIN,UA Negative Negative, Trace, Small (1+), Moderate (2+), Large (3+), 4+   Urobilinogen, UA     Nitrite, UA neg    Leukocytes, UA Negative Negative   Appearance     Odor      Assessment & Plan:  1) High-risk pregnancy [id number] at [redacted]w[redacted]d with an Estimated Date of Delivery: 11/28/18   2) Rh isoimmunization, stable, MCA dopplers next week, deliver after 34 weeks if evidence of compromise  3) Uterine didelphys, stable, previous c section x 2, desires BTL  Meds: No orders of the defined types were placed in this encounter.   Labs/procedures today:   Treatment Plan:  Sonogram + MCA Dopplers next week  Reviewed: Preterm labor symptoms and general obstetric precautions including but not limited to vaginal bleeding, contractions, leaking of fluid and fetal movement were reviewed in detail with the patient.  All questions were answered.  Follow-up: Return in about 1 week (around 10/04/2018) for BPP/sono, HROB, with Dr Despina Hidden.  Orders Placed This Encounter  Procedures  . POC Urinalysis Dipstick OB   Amaryllis Dyke Denene Alamillo  09/27/2018 11:35 AM

## 2018-09-30 ENCOUNTER — Telehealth: Payer: Self-pay | Admitting: *Deleted

## 2018-09-30 ENCOUNTER — Other Ambulatory Visit: Payer: Self-pay

## 2018-09-30 ENCOUNTER — Encounter (HOSPITAL_COMMUNITY): Payer: Self-pay

## 2018-09-30 ENCOUNTER — Encounter: Payer: Self-pay | Admitting: Obstetrics and Gynecology

## 2018-09-30 ENCOUNTER — Ambulatory Visit (INDEPENDENT_AMBULATORY_CARE_PROVIDER_SITE_OTHER): Payer: Medicaid Other | Admitting: *Deleted

## 2018-09-30 ENCOUNTER — Inpatient Hospital Stay (HOSPITAL_COMMUNITY)
Admission: AD | Admit: 2018-09-30 | Discharge: 2018-09-30 | Payer: Medicaid Other | Attending: Obstetrics and Gynecology | Admitting: Obstetrics and Gynecology

## 2018-09-30 ENCOUNTER — Encounter: Payer: Self-pay | Admitting: *Deleted

## 2018-09-30 ENCOUNTER — Inpatient Hospital Stay (HOSPITAL_BASED_OUTPATIENT_CLINIC_OR_DEPARTMENT_OTHER): Payer: Medicaid Other

## 2018-09-30 VITALS — BP 113/71 | HR 88 | Wt 295.0 lb

## 2018-09-30 DIAGNOSIS — O34593 Maternal care for other abnormalities of gravid uterus, third trimester: Secondary | ICD-10-CM

## 2018-09-30 DIAGNOSIS — O368130 Decreased fetal movements, third trimester, not applicable or unspecified: Secondary | ICD-10-CM | POA: Insufficient documentation

## 2018-09-30 DIAGNOSIS — O361930 Maternal care for other isoimmunization, third trimester, not applicable or unspecified: Secondary | ICD-10-CM

## 2018-09-30 DIAGNOSIS — O289 Unspecified abnormal findings on antenatal screening of mother: Secondary | ICD-10-CM | POA: Diagnosis not present

## 2018-09-30 DIAGNOSIS — O99213 Obesity complicating pregnancy, third trimester: Secondary | ICD-10-CM

## 2018-09-30 DIAGNOSIS — Z3483 Encounter for supervision of other normal pregnancy, third trimester: Secondary | ICD-10-CM

## 2018-09-30 DIAGNOSIS — Z3A31 31 weeks gestation of pregnancy: Secondary | ICD-10-CM

## 2018-09-30 DIAGNOSIS — Z331 Pregnant state, incidental: Secondary | ICD-10-CM

## 2018-09-30 DIAGNOSIS — O361190 Maternal care for Anti-A sensitization, unspecified trimester, not applicable or unspecified: Secondary | ICD-10-CM | POA: Insufficient documentation

## 2018-09-30 DIAGNOSIS — O09523 Supervision of elderly multigravida, third trimester: Secondary | ICD-10-CM | POA: Diagnosis not present

## 2018-09-30 DIAGNOSIS — O34219 Maternal care for unspecified type scar from previous cesarean delivery: Secondary | ICD-10-CM

## 2018-09-30 DIAGNOSIS — Z87891 Personal history of nicotine dependence: Secondary | ICD-10-CM | POA: Insufficient documentation

## 2018-09-30 DIAGNOSIS — O288 Other abnormal findings on antenatal screening of mother: Secondary | ICD-10-CM

## 2018-09-30 DIAGNOSIS — Z1389 Encounter for screening for other disorder: Secondary | ICD-10-CM

## 2018-09-30 DIAGNOSIS — O359XX0 Maternal care for (suspected) fetal abnormality and damage, unspecified, not applicable or unspecified: Secondary | ICD-10-CM

## 2018-09-30 DIAGNOSIS — O368190 Decreased fetal movements, unspecified trimester, not applicable or unspecified: Secondary | ICD-10-CM

## 2018-09-30 LAB — CBC
HCT: 35.9 % — ABNORMAL LOW (ref 36.0–46.0)
Hemoglobin: 11.4 g/dL — ABNORMAL LOW (ref 12.0–15.0)
MCH: 25.9 pg — ABNORMAL LOW (ref 26.0–34.0)
MCHC: 31.8 g/dL (ref 30.0–36.0)
MCV: 81.6 fL (ref 80.0–100.0)
Platelets: 269 K/uL (ref 150–400)
RBC: 4.4 MIL/uL (ref 3.87–5.11)
RDW: 13 % (ref 11.5–15.5)
WBC: 12 K/uL — ABNORMAL HIGH (ref 4.0–10.5)
nRBC: 0 % (ref 0.0–0.2)

## 2018-09-30 LAB — PROTEIN / CREATININE RATIO, URINE
Creatinine, Urine: 113.46 mg/dL
Protein Creatinine Ratio: 0.12 mg/mg{creat} (ref 0.00–0.15)
Total Protein, Urine: 14 mg/dL

## 2018-09-30 LAB — POCT URINALYSIS DIPSTICK OB
Blood, UA: NEGATIVE
GLUCOSE, UA: NEGATIVE
Ketones, UA: NEGATIVE
LEUKOCYTES UA: NEGATIVE
Nitrite, UA: NEGATIVE

## 2018-09-30 LAB — TYPE AND SCREEN
ABO/RH(D): A NEG
Antibody Screen: POSITIVE
UNIT DIVISION: 0
Unit division: 0

## 2018-09-30 LAB — COMPREHENSIVE METABOLIC PANEL
ALT: 11 U/L (ref 0–44)
AST: 14 U/L — ABNORMAL LOW (ref 15–41)
Albumin: 2.8 g/dL — ABNORMAL LOW (ref 3.5–5.0)
Alkaline Phosphatase: 129 U/L — ABNORMAL HIGH (ref 38–126)
Anion gap: 8 (ref 5–15)
BUN: <5 mg/dL — ABNORMAL LOW (ref 6–20)
CO2: 20 mmol/L — ABNORMAL LOW (ref 22–32)
Calcium: 8.9 mg/dL (ref 8.9–10.3)
Chloride: 107 mmol/L (ref 98–111)
Creatinine, Ser: 0.73 mg/dL (ref 0.44–1.00)
GFR calc Af Amer: >60 mL/min (ref >60)
GFR calc non Af Amer: >60 mL/min (ref >60)
Glucose, Bld: 114 mg/dL — ABNORMAL HIGH (ref 70–99)
Potassium: 3.8 mmol/L (ref 3.5–5.1)
Sodium: 135 mmol/L (ref 135–145)
Total Bilirubin: 0.5 mg/dL (ref 0.3–1.2)
Total Protein: 6.8 g/dL (ref 6.5–8.1)

## 2018-09-30 LAB — BPAM RBC
Blood Product Expiration Date: 202003232359
Blood Product Expiration Date: 202003242359
Unit Type and Rh: 600
Unit Type and Rh: 600

## 2018-09-30 LAB — RAPID URINE DRUG SCREEN, HOSP PERFORMED
Amphetamines: NOT DETECTED
Barbiturates: NOT DETECTED
Benzodiazepines: NOT DETECTED
Cocaine: NOT DETECTED
Opiates: NOT DETECTED
TETRAHYDROCANNABINOL: NOT DETECTED

## 2018-09-30 LAB — KLEIHAUER-BETKE STAIN
# Vials RhIg: 1
Fetal Cells %: 0 %
Quantitation Fetal Hemoglobin: 0 mL

## 2018-09-30 MED ORDER — LACTATED RINGERS IV SOLN
INTRAVENOUS | Status: DC
  Administered 2018-09-30: 15:00:00 via INTRAVENOUS

## 2018-09-30 MED ORDER — BETAMETHASONE SOD PHOS & ACET 6 (3-3) MG/ML IJ SUSP
12.00 | INTRAMUSCULAR | Status: DC
Start: 2018-10-01 — End: 2018-09-30

## 2018-09-30 MED ORDER — LACTATED RINGERS IV BOLUS
1000.0000 mL | Freq: Once | INTRAVENOUS | Status: AC
  Administered 2018-09-30: 1000 mL via INTRAVENOUS

## 2018-09-30 MED ORDER — BETAMETHASONE SOD PHOS & ACET 6 (3-3) MG/ML IJ SUSP
12.0000 mg | INTRAMUSCULAR | Status: DC
  Administered 2018-09-30: 12 mg via INTRAMUSCULAR
  Filled 2018-09-30: qty 2

## 2018-09-30 NOTE — MAU Note (Signed)
Pt ate around 1045, sausage crossaint and sweet tea.

## 2018-09-30 NOTE — MAU Provider Note (Addendum)
History    CSN: [id number] Arrival date and time: 09/30/18 1038  Chief Complaint  Patient presents with  . Decreased Fetal Movement   HPI 38yo [id number] at [redacted]w[redacted]d who presents with decreased fetal movement for last couple of days. Pregnancy complicated by history of uterus didelphys, Rh(-), history of cesarean delivery, HLD, morbid obesity, AMA. Was sent over from clinic after prolonged monitoring because unable to get reactive tracing.  She states she started noticing decreased movement at her last clinic appointment a few days ago. States she thought it was related to her baby being a bigger size. Also noticed some diarrhea a few days ago which occurred once in the morning. Very active during the day caring for 83 and 38 yo, walks a lot. Tried sugary foods and beverage yesterday and noticed some faint movements. Denies any contractions, vaginal bleeding, discharge. Denies any headaches, shortness of breath, vision changes, lightheadedness. Denies any abdominal pain, N/V.   OB History    Gravida  4   Para  2   Term  2   Preterm      AB  1   Living  2     SAB  1   TAB      Ectopic      Multiple  0   Live Births  2           Past Medical History:  Diagnosis Date  . Anemia   . Anxiety    no meds  . Complete bicornuate uterus    2 cervices  . Heartburn in pregnancy    zantac prn  . Hyperlipidemia    diet controlled, no med  . Obesity   . Uterus didelphys 2 cervices  . Vaginal discharge during pregnancy in first trimester 05/02/2014  . Yeast infection 05/02/2014    Past Surgical History:  Procedure Laterality Date  . CESAREAN SECTION  06/2009   [facility]  . CESAREAN SECTION N/A 12/21/2014   Procedure: REPEAT CESAREAN SECTION;  Surgeon: Lazaro Arms, MD;  Location: [facility];  Service: Obstetrics;  Laterality: N/A;  . WISDOM TOOTH EXTRACTION      Family History  Problem Relation Age of Onset  . Diabetes Maternal Grandfather   . Hypertension Maternal Grandfather   .  Diabetes Paternal Grandmother   . Hypertension Paternal Grandmother   . Diabetes Paternal Grandfather   . Hypertension Paternal Grandfather   . Fibroids Mother   . Hyperlipidemia Father     Social History   Tobacco Use  . Smoking status: Former Smoker    Packs/day: 0.10    Years: 15.00    Pack years: 1.50    Types: Cigarettes    Last attempt to quit: 03/2018    Years since quitting: 0.5  . Smokeless tobacco: Never Used  Substance Use Topics  . Alcohol use: Not Currently    Comment: social but none with pregnancy  . Drug use: No    Allergies: No Known Allergies  Medications Prior to Admission  Medication Sig Dispense Refill Last Dose  . Calcium Carbonate Antacid (TUMS PO) Take by mouth 3 (three) times daily as needed.   Taking  . Elastic Bandages & Supports (FUTURO FIRM COMPRESSION HOSE) MISC Left and right leg compression hose 2 each 1 Taking  . Elastic Bandages & Supports (WRIST SPLINT/COCK-UP/LEFT M) MISC Wear at night prn numbness 1 each 1 Taking  . Elastic Bandages & Supports (WRIST SPLINT/COCK-UP/RIGHT M) MISC Wear at night prn numbness in hands 1 each  1 Taking   Review of Systems  Constitutional: Negative for activity change, fatigue, fever and unexpected weight change.  HENT: Negative for congestion.   Eyes: Negative for visual disturbance.  Respiratory: Negative for shortness of breath.   Cardiovascular: Negative for chest pain, palpitations and leg swelling.  Gastrointestinal: Negative for abdominal pain, nausea and vomiting.  Genitourinary: Negative for dysuria, vaginal bleeding and vaginal discharge.  Neurological: Negative for light-headedness and headaches.  Psychiatric/Behavioral: Negative for sleep disturbance. The patient is not nervous/anxious.    Physical Exam   Blood pressure 134/81, pulse 100, temperature 97.8 F (36.6 C), temperature source Oral, resp. rate 18, last menstrual period 02/21/2018, SpO2 98 %.  Physical Exam  Nursing note and vitals  reviewed. Constitutional: She is oriented to person, place, and time. She appears well-developed and well-nourished. No distress.  HENT:  Head: Normocephalic and atraumatic.  Eyes: Conjunctivae and EOM are normal. No scleral icterus.  Cardiovascular:  tachycardia  Respiratory: Effort normal. No respiratory distress.  GI: Soft. Bowel sounds are normal. She exhibits no distension. There is no abdominal tenderness.  Gravid  Genitourinary:    Genitourinary Comments: SVE: FT/50/-3, posterior, unable to palpate 2nd cervix   Musculoskeletal:        General: No edema.  Neurological: She is alert and oriented to person, place, and time.  Skin: Skin is warm and dry. No rash noted.  Psychiatric: She has a normal mood and affect. Her behavior is normal.   MAU Course  Procedures  MDM -- decreased fetal movement, NRNST thus far, will obtain BPP and continue prolonged monitoring -- discussed case with MFM, Dr. Judeth Cornfield who states concerns for fetal ascites and scalp edema, will get additional images and MCA dopplers to evaluate further. Unclear etiology but DDX is broad including fetal anemia, fetal-maternal hemorrhage, infection -- anti-D antibody present in beginning of pregnancy - initially too weak to titer then up 1:32, received rhogam per sticky note 2/20 but not reported in [facility]? Had follow-up growth ultrasound with increased anti-D titer, seemed to be wnl at that time -- will obtain labs for work-up including KB, TORCH labs, CBC, CMP, T&S -- will insert IV, start IVF and keep NPO, last ate around 10:45 am this morning -- discussed work-up with Dr. Vergie Living who will discuss further with Dr. Judeth Cornfield  Signed out to Donette Larry, CNM at 1:05PM  [facility]. Earlene Plater, DO OB/GYN Fellow  [phone]: Plan for transfer to [facility] per Dr. Vergie Living Assessment and Plan  [redacted] weeks gestation Decreased fetal movement Fetal ascites Transfer to [facility], [place]  09/30/2018 1:55 PM

## 2018-09-30 NOTE — Telephone Encounter (Signed)
Patient called stating she has not felt the baby move all day, "basically since early morning".  She has tried laying down, drinking juice/eating but still has not felt the baby.  I advised the patient to come to our office for an NST but patient states she lives in [place] and would not be able to make it to our office by 5pm.  I then [facility]  Patient asked if she could come in tomorrow morning for test to be done but I explained to the patient that a NST is not something that should be put off for decreased fetal movement and again advised [facility].  Explained where to go and when she checked in to tell them the baby has not been active and they would know what to do.  Pt verbalized understanding and stated she would go.

## 2018-09-30 NOTE — MAU Note (Signed)
Sent from office for further eval.  Decreased FM since yesterday.  Office called report; non-reactive, decreased variability and variables.  Pt denies pain, bleeding or leaking.
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
## 2018-10-01 LAB — HSV 2 ANTIBODY, IGG: HSV 2 Glycoprotein G Ab, IgG: <0.91 index (ref 0.00–0.90)

## 2018-10-01 LAB — HIV ANTIBODY (ROUTINE TESTING W REFLEX): HIV Screen 4th Generation wRfx: NONREACTIVE

## 2018-10-01 LAB — CMV ANTIBODY, IGG (EIA): CMV Ab - IgG: <0.6 U/mL (ref 0.00–0.59)

## 2018-10-01 LAB — TOXOPLASMA GONDII ANTIBODY, IGG: Toxoplasma IgG Ratio: <3 IU/mL (ref 0.0–7.1)

## 2018-10-01 LAB — RPR: RPR Ser Ql: NONREACTIVE

## 2018-10-01 LAB — RUBELLA SCREEN: Rubella: 3 index (ref >0.99)

## 2018-10-01 LAB — HSV 1 ANTIBODY, IGG: HSV 1 Glycoprotein G Ab, IgG: 7.98 index — ABNORMAL HIGH (ref 0.00–0.90)

## 2018-10-02 MED ORDER — OXYTOCIN-SODIUM CHLORIDE 20-0.9 UT/500ML-% IV SOLN
10.00 | INTRAVENOUS | Status: DC
Start: ? — End: 2018-10-02

## 2018-10-02 MED ORDER — GENERIC EXTERNAL MEDICATION
324.00 | Status: DC
Start: ? — End: 2018-10-02

## 2018-10-02 MED ORDER — SIMETHICONE 80 MG PO CHEW
80.00 | CHEWABLE_TABLET | ORAL | Status: DC
Start: ? — End: 2018-10-02

## 2018-10-02 MED ORDER — METHYLERGONOVINE MALEATE 0.2 MG/ML IJ SOLN
.20 | INTRAMUSCULAR | Status: DC
Start: ? — End: 2018-10-02

## 2018-10-02 MED ORDER — ALUM & MAG HYDROXIDE-SIMETH 400-400-40 MG/5ML PO SUSP
15.00 | ORAL | Status: DC
Start: ? — End: 2018-10-02

## 2018-10-02 MED ORDER — OXYCODONE HCL 5 MG PO TABS
5.00 | ORAL_TABLET | ORAL | Status: DC
Start: ? — End: 2018-10-02

## 2018-10-02 MED ORDER — MISOPROSTOL 200 MCG PO TABS
800.00 | ORAL_TABLET | ORAL | Status: DC
Start: ? — End: 2018-10-02

## 2018-10-02 MED ORDER — DOCUSATE SODIUM 100 MG PO CAPS
100.00 | ORAL_CAPSULE | ORAL | Status: DC
Start: 2018-10-03 — End: 2018-10-02

## 2018-10-02 MED ORDER — ACETAMINOPHEN 325 MG PO TABS
975.00 | ORAL_TABLET | ORAL | Status: DC
Start: ? — End: 2018-10-02

## 2018-10-02 MED ORDER — DIPHENHYDRAMINE HCL 25 MG PO CAPS
25.00 | ORAL_CAPSULE | ORAL | Status: DC
Start: ? — End: 2018-10-02

## 2018-10-02 MED ORDER — ONDANSETRON HCL 4 MG/2ML IJ SOLN
4.00 | INTRAMUSCULAR | Status: DC
Start: ? — End: 2018-10-02

## 2018-10-02 MED ORDER — PNV PRENATAL PLUS MULTIVITAMIN 27-1 MG PO TABS
1.00 | ORAL_TABLET | ORAL | Status: DC
Start: 2018-10-04 — End: 2018-10-02

## 2018-10-02 MED ORDER — OXYCODONE HCL 5 MG PO TABS
10.00 | ORAL_TABLET | ORAL | Status: DC
Start: ? — End: 2018-10-02

## 2018-10-02 MED ORDER — IBUPROFEN 600 MG PO TABS
600.00 | ORAL_TABLET | ORAL | Status: DC
Start: 2018-10-03 — End: 2018-10-02

## 2018-10-02 MED ORDER — CARBOPROST TROMETHAMINE 250 MCG/ML IM SOLN
250.00 | INTRAMUSCULAR | Status: DC
Start: ? — End: 2018-10-02

## 2018-10-02 MED ORDER — ENOXAPARIN SODIUM 60 MG/0.6ML ~~LOC~~ SOLN
60.00 | SUBCUTANEOUS | Status: DC
Start: 2018-10-04 — End: 2018-10-02

## 2018-10-02 MED ORDER — DIPHENOXYLATE-ATROPINE 2.5-0.025 MG PO TABS
2.00 | ORAL_TABLET | ORAL | Status: DC
Start: ? — End: 2018-10-02

## 2018-10-04 ENCOUNTER — Encounter: Payer: Self-pay | Admitting: Obstetrics & Gynecology

## 2018-10-04 ENCOUNTER — Other Ambulatory Visit: Payer: Self-pay

## 2019-02-17 ENCOUNTER — Other Ambulatory Visit: Payer: Self-pay

## 2019-02-17 ENCOUNTER — Emergency Department (HOSPITAL_COMMUNITY)
Admission: EM | Admit: 2019-02-17 | Discharge: 2019-02-17 | Disposition: A | Discharge status: Home / Self Care | Payer: Medicaid Other | Attending: Emergency Medicine | Admitting: Emergency Medicine

## 2019-02-17 DIAGNOSIS — Y990 Civilian activity done for income or pay: Secondary | ICD-10-CM | POA: Diagnosis not present

## 2019-02-17 DIAGNOSIS — S0181XA Laceration without foreign body of other part of head, initial encounter: Secondary | ICD-10-CM | POA: Diagnosis not present

## 2019-02-17 DIAGNOSIS — Z23 Encounter for immunization: Secondary | ICD-10-CM | POA: Diagnosis not present

## 2019-02-17 DIAGNOSIS — Y929 Unspecified place or not applicable: Secondary | ICD-10-CM | POA: Diagnosis not present

## 2019-02-17 DIAGNOSIS — S0990XA Unspecified injury of head, initial encounter: Secondary | ICD-10-CM | POA: Diagnosis present

## 2019-02-17 DIAGNOSIS — Y9389 Activity, other specified: Secondary | ICD-10-CM | POA: Insufficient documentation

## 2019-02-17 DIAGNOSIS — Z87891 Personal history of nicotine dependence: Secondary | ICD-10-CM | POA: Insufficient documentation

## 2019-02-17 MED ORDER — TETANUS-DIPHTH-ACELL PERTUSSIS 5-2.5-18.5 LF-MCG/0.5 IM SUSP
0.5000 mL | Freq: Once | INTRAMUSCULAR | Status: AC
  Administered 2019-02-17: 0.5 mL via INTRAMUSCULAR
  Filled 2019-02-17: qty 0.5

## 2019-02-17 MED ORDER — LIDOCAINE-EPINEPHRINE (PF) 2 %-1:200000 IJ SOLN
20.0000 mL | Freq: Once | INTRAMUSCULAR | Status: AC
  Administered 2019-02-17: 20 mL
  Filled 2019-02-17: qty 20

## 2019-02-17 MED ORDER — ACETAMINOPHEN 325 MG PO TABS
650.0000 mg | ORAL_TABLET | Freq: Once | ORAL | Status: AC
  Administered 2019-02-17: 650 mg via ORAL
  Filled 2019-02-17: qty 2

## 2019-02-17 NOTE — ED Notes (Signed)
Head wound bandaged.
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
## 2019-02-17 NOTE — ED Notes (Signed)
Patient verbalizes understanding of discharge instructions. Opportunity for questioning and answers were provided. Armband removed by staff, pt discharged from ED.  

## 2019-02-17 NOTE — ED Notes (Signed)
Pt mom Aundra Dubin [phone]
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
## 2019-02-20 ENCOUNTER — Encounter (HOSPITAL_COMMUNITY): Payer: Self-pay

## 2019-03-29 ENCOUNTER — Ambulatory Visit: Payer: Self-pay | Admitting: Family Medicine

## 2019-11-03 ENCOUNTER — Ambulatory Visit: Payer: Medicaid Other | Admitting: Surgery

## 2019-11-03 ENCOUNTER — Encounter: Payer: Self-pay | Admitting: Surgery

## 2019-11-03 ENCOUNTER — Other Ambulatory Visit: Payer: Self-pay

## 2019-11-03 VITALS — BP 122/87 | HR 82 | Temp 97.7°F | Resp 14 | Ht 63.0 in | Wt 291.4 lb

## 2019-11-03 DIAGNOSIS — L905 Scar conditions and fibrosis of skin: Secondary | ICD-10-CM | POA: Diagnosis not present

## 2019-11-03 NOTE — Patient Instructions (Addendum)
May try using Vitamin E ointment/lotion to the area to help soften the area for a few weeks to a month.  Make sure you use Sunscreen to the area when in the sun.    We can refer you to Plastic Surgery if you would like, just let us know.   Follow-up with our office as needed.  Please call and ask to speak with a nurse if you develop questions or concerns.
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
## 2020-01-16 ENCOUNTER — Telehealth: Payer: Self-pay | Admitting: *Deleted

## 2020-01-16 DIAGNOSIS — L905 Scar conditions and fibrosis of skin: Secondary | ICD-10-CM

## 2020-01-16 NOTE — Telephone Encounter (Signed)
Patient was seen in April 2021 by Dr Aleen Campi and she came in for a scar on forehead and was told to try Vitamin E. Patient stated that it has not help and she wants to see about getting a referral to plastics for evaluation of a scar revision. Please call and advise

## 2020-01-16 NOTE — Telephone Encounter (Signed)
Referral has been placed for [facility]. They will contact the patient to schedule this appointment.

## 2020-01-30 ENCOUNTER — Other Ambulatory Visit: Payer: Self-pay

## 2020-01-30 ENCOUNTER — Encounter (HOSPITAL_COMMUNITY): Payer: Self-pay | Admitting: Emergency Medicine

## 2020-01-30 ENCOUNTER — Emergency Department (HOSPITAL_COMMUNITY)
Admission: EM | Admit: 2020-01-30 | Discharge: 2020-01-31 | Disposition: A | Discharge status: Home / Self Care | Payer: Medicaid Other | Attending: Emergency Medicine | Admitting: Emergency Medicine

## 2020-01-30 DIAGNOSIS — R110 Nausea: Secondary | ICD-10-CM | POA: Insufficient documentation

## 2020-01-30 DIAGNOSIS — Z87891 Personal history of nicotine dependence: Secondary | ICD-10-CM | POA: Insufficient documentation

## 2020-01-30 LAB — COMPREHENSIVE METABOLIC PANEL
ALT: 12 U/L (ref 0–44)
AST: 14 U/L — ABNORMAL LOW (ref 15–41)
Albumin: 4 g/dL (ref 3.5–5.0)
Alkaline Phosphatase: 59 U/L (ref 38–126)
Anion gap: 15 (ref 5–15)
BUN: 8 mg/dL (ref 6–20)
CO2: 19 mmol/L — ABNORMAL LOW (ref 22–32)
Calcium: 8.9 mg/dL (ref 8.9–10.3)
Chloride: 103 mmol/L (ref 98–111)
Creatinine, Ser: 0.82 mg/dL (ref 0.44–1.00)
GFR calc Af Amer: >60 mL/min (ref >60)
GFR calc non Af Amer: >60 mL/min (ref >60)
Glucose, Bld: 94 mg/dL (ref 70–99)
Potassium: 3.9 mmol/L (ref 3.5–5.1)
Sodium: 137 mmol/L (ref 135–145)
Total Bilirubin: 0.9 mg/dL (ref 0.3–1.2)
Total Protein: 8 g/dL (ref 6.5–8.1)

## 2020-01-30 LAB — CBC
HCT: 41.2 % (ref 36.0–46.0)
Hemoglobin: 13 g/dL (ref 12.0–15.0)
MCH: 26 pg (ref 26.0–34.0)
MCHC: 31.6 g/dL (ref 30.0–36.0)
MCV: 82.4 fL (ref 80.0–100.0)
Platelets: 411 10*3/uL — ABNORMAL HIGH (ref 150–400)
RBC: 5 MIL/uL (ref 3.87–5.11)
RDW: 14.3 % (ref 11.5–15.5)
WBC: 11.5 10*3/uL — ABNORMAL HIGH (ref 4.0–10.5)
nRBC: 0 % (ref 0.0–0.2)

## 2020-01-30 LAB — LIPASE, BLOOD: Lipase: 22 U/L (ref 11–51)

## 2020-01-30 NOTE — ED Triage Notes (Signed)
Pt c/o "extreme" nausea that started today. 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

## 2020-01-31 ENCOUNTER — Emergency Department (HOSPITAL_COMMUNITY): Payer: Medicaid Other

## 2020-01-31 LAB — URINALYSIS, ROUTINE W REFLEX MICROSCOPIC
Bacteria, UA: NONE SEEN
Bilirubin Urine: NEGATIVE
Glucose, UA: NEGATIVE mg/dL
Ketones, ur: 80 mg/dL — AB
Leukocytes,Ua: NEGATIVE
Nitrite: NEGATIVE
Protein, ur: 30 mg/dL — AB
Specific Gravity, Urine: 1.027 (ref 1.005–1.030)
pH: 5 (ref 5.0–8.0)

## 2020-01-31 MED ORDER — ONDANSETRON 4 MG PO TBDP: 4.0000 mg | ORAL_TABLET | Freq: Three times a day (TID) | ORAL | 0 refills | Status: DC | PRN

## 2020-01-31 MED ORDER — ONDANSETRON HCL 4 MG/2ML IJ SOLN
4.0000 mg | Freq: Once | INTRAMUSCULAR | Status: AC
  Administered 2020-01-31: 4 mg via INTRAVENOUS
  Filled 2020-01-31: qty 2

## 2020-01-31 MED ORDER — HALOPERIDOL LACTATE 5 MG/ML IJ SOLN
2.0000 mg | Freq: Once | INTRAMUSCULAR | Status: AC
  Administered 2020-01-31: 2 mg via INTRAVENOUS
  Filled 2020-01-31: qty 1

## 2020-01-31 MED ORDER — IOHEXOL 300 MG/ML  SOLN
100.0000 mL | Freq: Once | INTRAMUSCULAR | Status: AC | PRN
  Administered 2020-01-31: 100 mL via INTRAVENOUS

## 2020-01-31 MED ORDER — SODIUM CHLORIDE 0.9 % IV BOLUS
1000.0000 mL | Freq: Once | INTRAVENOUS | Status: AC
  Administered 2020-01-31: 1000 mL via INTRAVENOUS

## 2020-01-31 NOTE — Discharge Instructions (Addendum)
You were evaluated in the Emergency Department and after careful evaluation, we did not find any emergent condition requiring admission or further testing in the hospital.  We discussed your symptoms and your CT scan results with the bariatric surgery team at [facility].  They should be aware of your CT results but please mention your ED visit during your virtual appointment tomorrow.  They should be able to fit you into Dr. Roanna Banning clinic on Friday.  CT Results 7/7: 1. 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 No evidence of obstruction along the alimentary limb. However, there is some marked fluid distention and questionable irregular twisting of the proximal excluded portion of the stomach with some mild mural thickening. No pneumatosis or extraluminal collection or abscess is seen. No evidence of perforation. 2. Fairly recent postsurgical changes along the anterior abdominal wall with small amount of thickening suggesting the sites of incision and port placement. No large body wall hematoma or abscess. 3. Trace fluid in the pelvis is nonspecific in a reproductive age female and given recent procedure. 4. Uterine didelphys.  Please return to the Emergency Department if you experience any worsening of your condition.  We encourage you to follow up with a primary care provider.  Thank you for allowing [place] to be a part of your care.

## 2020-01-31 NOTE — ED Provider Notes (Signed)
AP-EMERGENCY DEPT [facility] Emergency Department Provider Note MRN:  [id number]  Arrival date & time: 01/31/20     Chief Complaint   Nausea   History of Present Illness   Amanda Sanchez is a 39 y.o. year-old female with a history of gastric bypass presenting to the ED with chief complaint of nausea.  Patient explains that she made a mistake and looked a spoon covered in pudding and whipped cream while she was prepping food for her children.  She was told not to have anything with this amount of sugar so soon after her gastric bypass surgery.  Since this event she has had significant nausea, no vomiting, just feeling generally very unwell.  Denies any significant abdominal pain, no fever, no diarrhea.  Symptoms moderate to severe, constant, no other exacerbating or alleviating factors.  Review of Systems  A complete 10 system review of systems was obtained and all systems are negative except as noted in the HPI and PMH.   Patient's Health History    Past Medical History:  Diagnosis Date  . Anemia   . Anxiety    no meds  . Complete bicornuate uterus    2 cervices  . Heartburn in pregnancy    zantac prn  . Hyperlipidemia    diet controlled, no med  . Obesity   . Uterus didelphys 2 cervices  . Vaginal discharge during pregnancy in first trimester 05/02/2014  . Yeast infection 05/02/2014    Past Surgical History:  Procedure Laterality Date  . CESAREAN SECTION  06/2009   [facility]  . CESAREAN SECTION N/A 12/21/2014   Procedure: REPEAT CESAREAN SECTION;  Surgeon: Lazaro Arms, MD;  Location: [facility];  Service: Obstetrics;  Laterality: N/A;  . CESAREAN SECTION  09/30/2018   [facility]  . gastric bypass N/A 02/22/2020  . WISDOM TOOTH EXTRACTION      Family History  Problem Relation Age of Onset  . Diabetes Maternal Grandfather   . Hypertension Maternal Grandfather   . Diabetes Paternal Grandmother   . Hypertension Paternal Grandmother   . Diabetes Paternal Grandfather   .  Hypertension Paternal Grandfather   . Fibroids Mother   . Hyperlipidemia Father     Social History   Socioeconomic History  . Marital status: Single    Spouse name: moniek Baum  . Number of children: 6  . Years of education: 67  . Highest education level: Not on file  Occupational History  . Not on file  Tobacco Use  . Smoking status: Former Smoker    Packs/day: 0.10    Years: 15.00    Pack years: 1.50    Types: Cigarettes    Quit date: 03/2018    Years since quitting: 1.8  . Smokeless tobacco: Never Used  Vaping Use  . Vaping Use: Never used  Substance and Sexual Activity  . Alcohol use: Not Currently    Comment: social but none with pregnancy  . Drug use: No  . Sexual activity: Yes    Birth control/protection: None  Other Topics Concern  . Not on file  Social History Narrative  . Not on file   Social Determinants of Health   Financial Resource Strain:   . Difficulty of Paying Living Expenses:   Food Insecurity:   . Worried About Programme researcher, broadcasting/film/video in the Last Year:   . Barista in the Last Year:   Transportation Needs:   . Freight forwarder (Medical):   Marland Kitchen Lack  of Transportation (Non-Medical):   Physical Activity:   . Days of Exercise per Week:   . Minutes of Exercise per Session:   Stress:   . Feeling of Stress :   Social Connections:   . Frequency of Communication with Friends and Family:   . Frequency of Social Gatherings with Friends and Family:   . Attends Religious Services:   . Active Member of Clubs or Organizations:   . Attends Banker Meetings:   Marland Kitchen Marital Status:   Intimate Partner Violence:   . Fear of Current or Ex-Partner:   . Emotionally Abused:   Marland Kitchen Physically Abused:   . Sexually Abused:      Physical Exam   Vitals:   01/30/20 2106  BP: (!) 144/93  Pulse: 80  Resp: 16  Temp: 99 F (37.2 C)  SpO2: 97%    CONSTITUTIONAL: Appears uncomfortable NEURO:  Alert and oriented x 3, no focal deficits EYES:   eyes equal and reactive ENT/NECK:  no LAD, no JVD CARDIO: Regular rate, well-perfused, normal S1 and S2 PULM:  CTAB no wheezing or rhonchi GI/GU:  normal bowel sounds, non-distended, non-tender MSK/SPINE:  No gross deformities, no edema SKIN:  no rash, atraumatic PSYCH:  Appropriate speech and behavior  *Additional and/or pertinent findings included in MDM below  Diagnostic and Interventional Summary    EKG Interpretation  Date/Time:    Ventricular Rate:    PR Interval:    QRS Duration:   QT Interval:    QTC Calculation:   R Axis:     Text Interpretation:        Labs Reviewed  COMPREHENSIVE METABOLIC PANEL - Abnormal; Notable for the following components:      Result Value   CO2 19 (*)    AST 14 (*)    All other components within normal limits  CBC - Abnormal; Notable for the following components:   WBC 11.5 (*)    Platelets 411 (*)    All other components within normal limits  URINALYSIS, ROUTINE W REFLEX MICROSCOPIC - Abnormal; Notable for the following components:   APPearance CLOUDY (*)    Hgb urine dipstick LARGE (*)    Ketones, ur 80 (*)    Protein, ur 30 (*)    All other components within normal limits  LIPASE, BLOOD  POC URINE PREG, ED    CT ABDOMEN PELVIS W CONTRAST  Final Result      Medications  ondansetron (ZOFRAN) injection 4 mg (4 mg Intravenous Given 01/31/20 0030)  sodium chloride 0.9 % bolus 1,000 mL (0 mLs Intravenous Stopped 01/31/20 0205)  haloperidol lactate (HALDOL) injection 2 mg (2 mg Intravenous Given 01/31/20 0210)  iohexol (OMNIPAQUE) 300 MG/ML solution 100 mL (100 mLs Intravenous Contrast Given 01/31/20 0215)     Procedures  /  Critical Care Procedures  ED Course and Medical Decision Making  I have reviewed the triage vital signs, the nursing notes, and pertinent available records from the EMR.  Listed above are laboratory and imaging tests that I personally ordered, reviewed, and interpreted and then considered in my medical decision  making (see below for details).     Nausea 1 week after gastric bypass surgery, benign abdomen, normal vital signs, will attempt symptomatic management and reassess.  Currently without indication for abdominal imaging.  Patient continues to have significant nausea, and so CT imaging obtained to exclude postoperative complication.  Haldol given intravenously.  CT is revealing distention of the bypassed portion of the stomach  as well as possible twisting.  These findings discussed with Dr. Lily Peer of the [facility] bariatric surgery department.  Patient clinically is doing better after Haldol, symptoms largely resolved.  Given the clinical improvement, Dr. Lily Peer felt patient was appropriate for discharge with close follow-up in the office with her surgeon.  She has a virtual appointment tomorrow with the bariatric surgery team and she should be able to be fit into the office on Friday.  Patient is agreeable with this plan and agrees to return the emergency department with any return of symptoms or worsening of condition.  Elmer Sow. Pilar Plate, MD [facility] Emergency Medicine [facility] [email or web address]  Final Clinical Impressions(s) / ED Diagnoses     ICD-10-CM   1. Nausea  R11.0     ED Discharge Orders    None       Discharge Instructions Discussed with and Provided to Patient:     Discharge Instructions     You were evaluated in the Emergency Department and after careful evaluation, we did not find any emergent condition requiring admission or further testing in the hospital.  We discussed your symptoms and your CT scan results with the bariatric surgery team at [facility]  They should be aware of your CT results but please mention your ED visit during your virtual appointment tomorrow.  They should be able to fit you into Dr. Roanna Banning clinic on Friday.  CT Results 7/7: 1. 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 No  evidence of obstruction along the alimentary limb. However, there is some marked fluid distention and questionable irregular twisting of the proximal excluded portion of the stomach with some mild mural thickening. No pneumatosis or extraluminal collection or abscess is seen. No evidence of perforation. 2. Fairly recent postsurgical changes along the anterior abdominal wall with small amount of thickening suggesting the sites of incision and port placement. No large body wall hematoma or abscess. 3. Trace fluid in the pelvis is nonspecific in a reproductive age female and given recent procedure. 4. Uterine didelphys.  Please return to the Emergency Department if you experience any worsening of your condition.  We encourage you to follow up with a primary care provider.  Thank you for allowing [place] to be a part of your care.        Sabas Sous, MD 01/31/20 [phone]

## 2020-02-21 ENCOUNTER — Ambulatory Visit (INDEPENDENT_AMBULATORY_CARE_PROVIDER_SITE_OTHER): Payer: Self-pay | Admitting: Plastic Surgery

## 2020-02-21 ENCOUNTER — Other Ambulatory Visit: Payer: Self-pay

## 2020-02-21 ENCOUNTER — Encounter: Payer: Self-pay | Admitting: Plastic Surgery

## 2020-02-21 VITALS — BP 125/86 | HR 81 | Temp 98.4°F | Ht 64.0 in | Wt 272.0 lb

## 2020-02-21 DIAGNOSIS — L905 Scar conditions and fibrosis of skin: Secondary | ICD-10-CM

## 2020-02-21 NOTE — Progress Notes (Signed)
Referring Provider Franciso Bend, NP [address] [place],  [place] [address]   CC:  Chief Complaint  Patient presents with  . Advice Only      Amanda Sanchez is an 39 y.o. female.  HPI: Patient presents to discuss scar on her right forehead.  She hit her head on a fence about a year ago.  This caused a blunt abrasion type injury.  Most of it healed fine but there is a segment in the right upper forehead that bothers her.  She is bothered by the raised appearance.  She has tried a lot of different topical treatments and feels like all improvements have leveled off.  No Known Allergies  Outpatient Encounter Medications as of 02/21/2020  Medication Sig  . atorvastatin (LIPITOR) 40 MG tablet Take by mouth. (Patient not taking: Reported on 02/21/2020)  . Calcium Carbonate Antacid (TUMS PO) Take by mouth 3 (three) times daily as needed. (Patient not taking: Reported on 02/21/2020)  . ferrous sulfate 325 (65 FE) MG tablet Take by mouth. (Patient not taking: Reported on 02/21/2020)  . fluticasone (FLONASE) 50 MCG/ACT nasal spray USE 1 SPRAYS IN EACH NOSTRIL ONCE DAILY (Patient not taking: Reported on 02/21/2020)  . gabapentin (NEURONTIN) 300 MG capsule Take by mouth. (Patient not taking: Reported on 02/21/2020)  . ibuprofen (ADVIL) 800 MG tablet Take 800 mg by mouth daily. (Patient not taking: Reported on 02/21/2020)  . levocetirizine (XYZAL) 5 MG tablet Take by mouth. (Patient not taking: Reported on 02/21/2020)  . liraglutide (VICTOZA) 18 MG/3ML SOPN Inject into the skin. (Patient not taking: Reported on 02/21/2020)  . metFORMIN (GLUCOPHAGE) 500 MG tablet Take by mouth. (Patient not taking: Reported on 02/21/2020)  . ondansetron (ZOFRAN ODT) 4 MG disintegrating tablet Take 1 tablet (4 mg total) by mouth every 8 (eight) hours as needed for nausea or vomiting. (Patient not taking: Reported on 02/21/2020)  . topiramate (TOPAMAX) 25 MG tablet Take by mouth. (Patient not taking: Reported on  02/21/2020)  . venlafaxine XR (EFFEXOR-XR) 37.5 MG 24 hr capsule Take by mouth. (Patient not taking: Reported on 02/21/2020)   No facility-administered encounter medications on file as of 02/21/2020.     Past Medical History:  Diagnosis Date  . Anemia   . Anxiety    no meds  . Complete bicornuate uterus    2 cervices  . Heartburn in pregnancy    zantac prn  . Hyperlipidemia    diet controlled, no med  . Obesity   . Uterus didelphys 2 cervices  . Vaginal discharge during pregnancy in first trimester 05/02/2014  . Yeast infection 05/02/2014    Past Surgical History:  Procedure Laterality Date  . CESAREAN SECTION  06/2009   [facility]  . CESAREAN SECTION N/A 12/21/2014   Procedure: REPEAT CESAREAN SECTION;  Surgeon: Lazaro Arms, MD;  Location: [facility] ORS;  Service: Obstetrics;  Laterality: N/A;  . CESAREAN SECTION  09/30/2018   [facility]  . gastric bypass N/A 02/22/2020  . WISDOM TOOTH EXTRACTION      Family History  Problem Relation Age of Onset  . Diabetes Maternal Grandfather   . Hypertension Maternal Grandfather   . Diabetes Paternal Grandmother   . Hypertension Paternal Grandmother   . Diabetes Paternal Grandfather   . Hypertension Paternal Grandfather   . Fibroids Mother   . Hyperlipidemia Father     Social History   Social History Narrative  . Not on file     Review of Systems General: Denies  fevers, chills, weight loss CV: Denies chest pain, shortness of breath, palpitations  Physical Exam Vitals with BMI 02/21/2020 01/31/2020 01/30/2020  Height 5\' 4"  - 5\' 3"   Weight 272 lbs - 272 lbs  BMI 46.67 - 48.19  Systolic 125 142  Diastolic 86 76 93  Pulse 81 75 80    General:  No acute distress,  Alert and oriented, Non-Toxic, Normal speech and affect Examination of the forehead shows a 3 to 4 cm curvilinear oblique scar in the right upper forehead area.  It is visible when she turns her head in certain directions and the light hits it directly.  Otherwise the scar itself is  not particularly wide or hypertrophic.  Assessment/Plan Patient presents to discuss a scar in the right forehead.  I discussed dermabrasion and surgical excision.  Ultimately discouraged her from surgical excision at this point because there is always a possibility that this makes it a little bit worse.  I explained that surgical excision would certainly make it look worse for at least a couple months afterwards.  I told her we are trying to get a dermabrasion machine here at the office and I thought that would be a good initial treatment with very low risk.  She is going to call back in 2 months to see if we have been able to get that machine.  02/21/2020, 4:17 PM

## 2020-04-13 ENCOUNTER — Other Ambulatory Visit: Payer: Self-pay

## 2020-04-13 ENCOUNTER — Emergency Department (HOSPITAL_COMMUNITY)
Admission: EM | Admit: 2020-04-13 | Discharge: 2020-04-14 | Disposition: A | Discharge status: Home / Self Care | Payer: Medicaid Other | Attending: Emergency Medicine | Admitting: Emergency Medicine

## 2020-04-13 ENCOUNTER — Encounter (HOSPITAL_COMMUNITY): Payer: Self-pay | Admitting: Emergency Medicine

## 2020-04-13 ENCOUNTER — Emergency Department (HOSPITAL_COMMUNITY): Payer: Medicaid Other

## 2020-04-13 DIAGNOSIS — U071 COVID-19: Secondary | ICD-10-CM | POA: Diagnosis not present

## 2020-04-13 DIAGNOSIS — R0602 Shortness of breath: Secondary | ICD-10-CM

## 2020-04-13 DIAGNOSIS — Z7984 Long term (current) use of oral hypoglycemic drugs: Secondary | ICD-10-CM | POA: Insufficient documentation

## 2020-04-13 DIAGNOSIS — Z87891 Personal history of nicotine dependence: Secondary | ICD-10-CM | POA: Insufficient documentation

## 2020-04-13 LAB — COMPREHENSIVE METABOLIC PANEL
ALT: 16 U/L (ref 0–44)
AST: 30 U/L (ref 15–41)
Albumin: 3.6 g/dL (ref 3.5–5.0)
Alkaline Phosphatase: 52 U/L (ref 38–126)
Anion gap: 16 — ABNORMAL HIGH (ref 5–15)
BUN: 9 mg/dL (ref 6–20)
CO2: 19 mmol/L — ABNORMAL LOW (ref 22–32)
Calcium: 8.9 mg/dL (ref 8.9–10.3)
Chloride: 101 mmol/L (ref 98–111)
Creatinine, Ser: 1.09 mg/dL — ABNORMAL HIGH (ref 0.44–1.00)
GFR calc Af Amer: >60 mL/min (ref >60)
GFR calc non Af Amer: >60 mL/min (ref >60)
Glucose, Bld: 112 mg/dL — ABNORMAL HIGH (ref 70–99)
Potassium: 3.5 mmol/L (ref 3.5–5.1)
Sodium: 136 mmol/L (ref 135–145)
Total Bilirubin: 0.9 mg/dL (ref 0.3–1.2)
Total Protein: 7.5 g/dL (ref 6.5–8.1)

## 2020-04-13 LAB — CBC WITH DIFFERENTIAL/PLATELET
Abs Immature Granulocytes: 0.03 10*3/uL (ref 0.00–0.07)
Basophils Absolute: 0 10*3/uL (ref 0.0–0.1)
Basophils Relative: 0 %
Eosinophils Absolute: 0 10*3/uL (ref 0.0–0.5)
Eosinophils Relative: 0 %
HCT: 45.4 % (ref 36.0–46.0)
Hemoglobin: 14.2 g/dL (ref 12.0–15.0)
Immature Granulocytes: 1 %
Lymphocytes Relative: 21 %
Lymphs Abs: 1.3 10*3/uL (ref 0.7–4.0)
MCH: 25.7 pg — ABNORMAL LOW (ref 26.0–34.0)
MCHC: 31.3 g/dL (ref 30.0–36.0)
MCV: 82.2 fL (ref 80.0–100.0)
Monocytes Absolute: 0.3 10*3/uL (ref 0.1–1.0)
Monocytes Relative: 5 %
Neutro Abs: 4.5 10*3/uL (ref 1.7–7.7)
Neutrophils Relative %: 73 %
Platelets: 204 10*3/uL (ref 150–400)
RBC: 5.52 MIL/uL — ABNORMAL HIGH (ref 3.87–5.11)
RDW: 14.5 % (ref 11.5–15.5)
WBC: 6.1 10*3/uL (ref 4.0–10.5)
nRBC: 0 % (ref 0.0–0.2)

## 2020-04-13 LAB — I-STAT BETA HCG BLOOD, ED (MC, WL, AP ONLY): I-stat hCG, quantitative: <5 m[IU]/mL (ref <5)

## 2020-04-13 MED ORDER — ACETAMINOPHEN 500 MG PO TABS
1000.0000 mg | ORAL_TABLET | Freq: Once | ORAL | Status: AC
  Administered 2020-04-13: 1000 mg via ORAL
  Filled 2020-04-13: qty 2

## 2020-04-13 MED ORDER — ALBUTEROL SULFATE HFA 108 (90 BASE) MCG/ACT IN AERS
2.0000 | INHALATION_SPRAY | Freq: Once | RESPIRATORY_TRACT | Status: AC
  Administered 2020-04-13: 2 via RESPIRATORY_TRACT
  Filled 2020-04-13: qty 6.7

## 2020-04-13 MED ORDER — LACTATED RINGERS IV BOLUS
1000.0000 mL | Freq: Once | INTRAVENOUS | Status: AC
  Administered 2020-04-13: 1000 mL via INTRAVENOUS

## 2020-04-13 MED ORDER — DEXAMETHASONE SODIUM PHOSPHATE 10 MG/ML IJ SOLN
10.0000 mg | Freq: Once | INTRAMUSCULAR | Status: AC
  Administered 2020-04-13: 10 mg via INTRAVENOUS
  Filled 2020-04-13: qty 1

## 2020-04-13 MED ORDER — ONDANSETRON HCL 4 MG/2ML IJ SOLN
4.0000 mg | Freq: Once | INTRAMUSCULAR | Status: AC
  Administered 2020-04-13: 4 mg via INTRAVENOUS
  Filled 2020-04-13: qty 2

## 2020-04-13 NOTE — Discharge Instructions (Addendum)
COVID-19 Home Management:  You have had a positive test for COVID-19. COVID-19 is caused by a virus. Viruses do not require or respond to antibiotics. Treatment is symptomatic care and it is important to note that these symptoms may last for 7-14 days.   Hand washing: Wash your hands throughout the day, but especially before and after touching the face, using the restroom, sneezing, coughing, or touching surfaces that have been coughed or sneezed upon. Hydration: Symptoms of most illnesses will be intensified and complicated by dehydration. Dehydration can also extend the duration of symptoms. Drink plenty of fluids and get plenty of rest. You should be drinking at least half a liter of water an hour to stay hydrated. Electrolyte drinks (ex. Gatorade, Powerade, Pedialyte) are also encouraged. You should be drinking enough fluids to make your urine light yellow, almost clear. If this is not the case, you are not drinking enough water. Please note that some of the treatments indicated below will not be effective if you are not adequately hydrated. Diet: Please concentrate on hydration, however, you may introduce food slowly.  Start with a clear liquid diet, progressed to a full liquid diet, and then bland solids as you are able. Pain or fever: Ibuprofen, Naproxen, or acetaminophen (generic for Tylenol) for pain or fever.  Antiinflammatory medications: Take 600 mg of ibuprofen every 6 hours or 440 mg (over the counter dose) to 500 mg (prescription dose) of naproxen every 12 hours for the next 3 days. After this time, these medications may be used as needed for pain. Take these medications with food to avoid upset stomach. Choose only one of these medications, do not take them together. Acetaminophen (generic for Tylenol): Should you continue to have additional pain while taking the ibuprofen or naproxen, you may add in acetaminophen as needed. Your daily total maximum amount of acetaminophen from all sources  should be limited to 4000mg /day for persons without liver problems, or 2000mg /day for those with liver problems. Nausea/vomiting: Use the ondansetron (generic for Zofran) for nausea or vomiting.  This medication may not prevent all vomiting or nausea, but can help facilitate better hydration. Things that can help with nausea/vomiting also include peppermint/menthol candies, vitamin B12, and ginger. Diarrhea: May use medications such as loperamide (Imodium) or Bismuth subsalicylate (Pepto-Bismol). Cough: Teas, warm liquids, broths, and honey can help with cough. Albuterol: May use the albuterol as needed for instances of shortness of breath. Zyrtec or Claritin: May add these medication daily to control underlying symptoms of congestion, sneezing, and other signs of allergies.  These medications are available over-the-counter. Generics: Cetirizine (generic for Zyrtec) and loratadine (generic for Claritin). Fluticasone: Use fluticasone (generic for Flonase), as directed, for nasal and sinus congestion.  This medication is available over-the-counter. Congestion: Plain guaifenesin (generic for plain Mucinex) may help relieve congestion. Saline sinus rinses and saline nasal sprays may also help relieve congestion.  Sore throat: Warm liquids or Chloraseptic spray may help soothe a sore throat. Gargle twice a day with a salt water solution made from a half teaspoon of salt in a cup of warm water.  Follow up: Follow up with a primary care provider within the next two weeks should symptoms fail to resolve. Return: Return to the ED for significantly worsening symptoms, shortness of breath, persistent/worsening chest pain, persistent vomiting, large amounts of blood in stool, worsening/localized abdominal pain, or any other major concerns.  For prescription assistance, may try using prescription discount sites or apps, such as goodrx.com

## 2020-04-13 NOTE — ED Triage Notes (Addendum)
Pt c/o shortness of breath, fatigue and nausea.  Pt st's she had a positive Covid test on 9/11.  Pt speaking in full sentences.  Has not been vaccinated

## 2020-04-14 LAB — SARS CORONAVIRUS 2 BY RT PCR (HOSPITAL ORDER, PERFORMED IN ~~LOC~~ HOSPITAL LAB): SARS Coronavirus 2: POSITIVE — AB

## 2020-04-14 MED ORDER — ONDANSETRON 4 MG PO TBDP: 4.0000 mg | ORAL_TABLET | Freq: Three times a day (TID) | ORAL | 0 refills | Status: DC | PRN

## 2020-04-14 NOTE — ED Provider Notes (Signed)
[facility] EMERGENCY DEPARTMENT Provider Note   CSN: [id number] Arrival date & time: 04/13/20  2016     History Chief Complaint  Patient presents with  . Shortness of Breath  . Covid Positive    Amanda Sanchez is a 39 y.o. female.  HPI      NOTNAMED Amanda Sanchez is a 39 y.o. female, with a history of anemia, anxiety, obesity, hyperlipidemia, presenting to the ED with shortness of breath over the last several days. Shortness of breath is intermittent.  Fever over the last several days. Cough, body aches, nausea beginning September 9.  Positive Covid test September 11. Admits to poor oral intake due to her nausea. Denies vomiting, chest pain, abdominal pain, urinary complaints, syncope, dizziness, lower extremity edema/pain, or any other complaints.   Past Medical History:  Diagnosis Date  . Anemia   . Anxiety    no meds  . Complete bicornuate uterus    2 cervices  . Heartburn in pregnancy    zantac prn  . Hyperlipidemia    diet controlled, no med  . Obesity   . Uterus didelphys 2 cervices  . Vaginal discharge during pregnancy in first trimester 05/02/2014  . Yeast infection 05/02/2014    Patient Active Problem List   Diagnosis Date Noted  . 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 Unwanted fertility 05/25/2018  . AMA (advanced maternal age) multigravida 35+ 05/25/2018  . Supervision of normal pregnancy 05/02/2018  . Rh negative state in antepartum period 06/13/2014  . History of cesarean delivery, currently pregnant 05/16/2014  . Uterus didelphys   . Candidal intertrigo 10/27/2011  . Morbid obesity (HCC) 10/20/2011  . Hyperlipidemia 10/20/2011    Past Surgical History:  Procedure Laterality Date  . CESAREAN SECTION  06/2009   [facility]  . CESAREAN SECTION N/A 12/21/2014   Procedure: REPEAT CESAREAN SECTION;  Surgeon: Lazaro Arms, MD;  Location: [facility] ORS;  Service: Obstetrics;  Laterality: N/A;  . CESAREAN  SECTION  09/30/2018   [facility]  . gastric bypass N/A 02/22/2020  . WISDOM TOOTH EXTRACTION       OB History    Gravida  4   Para  2   Term  2   Preterm      AB  1   Living  2     SAB  1   TAB      Ectopic      Multiple  0   Live Births  2           Family History  Problem Relation Age of Onset  . Diabetes Maternal Grandfather   . Hypertension Maternal Grandfather   . Diabetes Paternal Grandmother   . Hypertension Paternal Grandmother   . Diabetes Paternal Grandfather   . Hypertension Paternal Grandfather   . Fibroids Mother   . Hyperlipidemia Father     Social History   Tobacco Use  . Smoking status: Former Smoker    Packs/day: 0.10    Years: 15.00    Pack years: 1.50    Types: Cigarettes    Quit date: 03/2018    Years since quitting: 2.0  . Smokeless tobacco: Never Used  Vaping Use  . Vaping Use: Never used  Substance Use Topics  . Alcohol use: Not Currently    Comment: social but none with pregnancy  . Drug use: No    Home Medications Prior to Admission medications   Medication Sig Start Date End Date  Taking? Authorizing Provider  atorvastatin (LIPITOR) 40 MG tablet Take by mouth. Patient not taking: Reported on 02/21/2020    [provider]  Calcium Carbonate Antacid (TUMS PO) Take by mouth 3 (three) times daily as needed. Patient not taking: Reported on 02/21/2020    [provider]  ferrous sulfate 325 (65 FE) MG tablet Take by mouth. Patient not taking: Reported on 02/21/2020    [provider]  fluticasone (FLONASE) 50 MCG/ACT nasal spray USE 1 SPRAYS IN EACH NOSTRIL ONCE DAILY Patient not taking: Reported on 02/21/2020 10/06/19   [provider]  gabapentin (NEURONTIN) 300 MG capsule Take by mouth. Patient not taking: Reported on 02/21/2020 09/19/19   [provider]  ibuprofen (ADVIL) 800 MG tablet Take 800 mg by mouth daily. Patient not taking: Reported on 02/21/2020    [provider]    levocetirizine (XYZAL) 5 MG tablet Take by mouth. Patient not taking: Reported on 02/21/2020 10/06/19   [provider]  liraglutide (VICTOZA) 18 MG/3ML SOPN Inject into the skin. Patient not taking: Reported on 02/21/2020 10/26/19   [provider]  metFORMIN (GLUCOPHAGE) 500 MG tablet Take by mouth. Patient not taking: Reported on 02/21/2020    [provider]  ondansetron (ZOFRAN ODT) 4 MG disintegrating tablet Take 1 tablet (4 mg total) by mouth every 8 (eight) hours as needed for nausea or vomiting. Patient not taking: Reported on 02/21/2020 01/31/20   Sabas Sous, MD  ondansetron (ZOFRAN ODT) 4 MG disintegrating tablet Take 1 tablet (4 mg total) by mouth every 8 (eight) hours as needed for nausea or vomiting. 04/14/20   Analie Katzman C, PA-C  topiramate (TOPAMAX) 25 MG tablet Take by mouth. Patient not taking: Reported on 02/21/2020 07/24/19   [provider]  venlafaxine XR (EFFEXOR-XR) 37.5 MG 24 hr capsule Take by mouth. Patient not taking: Reported on 02/21/2020 05/01/19   [provider]    Allergies    Patient has no known allergies.  Review of Systems   Review of Systems  Constitutional: Positive for fatigue and fever.  Respiratory: Positive for cough and shortness of breath.   Cardiovascular: Negative for chest pain and leg swelling.  Gastrointestinal: Positive for nausea. Negative for abdominal pain, blood in stool, diarrhea and vomiting.  Genitourinary: Negative for dysuria.  Musculoskeletal: Negative for back pain, neck pain and neck stiffness.  Neurological: Negative for dizziness and syncope.  All other systems reviewed and are negative.   Physical Exam Updated Vital Signs BP 115/83   Pulse 99   Temp 99.1 F (37.3 C) (Oral)   Resp 19   Ht 5\' 4"  (1.626 m)   Wt 110.2 kg   SpO2 96%   BMI 41.71 kg/m   Physical Exam Vitals and nursing note reviewed.  Constitutional:      General: She is not in acute distress.     Appearance: She is well-developed. She is not diaphoretic.  HENT:     Head: Normocephalic and atraumatic.     Mouth/Throat:     Mouth: Mucous membranes are moist.     Pharynx: Oropharynx is clear.  Eyes:     Conjunctiva/sclera: Conjunctivae normal.  Cardiovascular:     Rate and Rhythm: Regular rhythm. Tachycardia present.     Pulses: Normal pulses.          Radial pulses are 2+ on the right side and 2+ on the left side.       Posterior tibial pulses are 2+ on the  right side and 2+ on the left side.     Heart sounds: Normal heart sounds.     Comments: Tactile temperature in the extremities appropriate and equal bilaterally. Mild tachycardia. Pulmonary:     Effort: Pulmonary effort is normal. No respiratory distress.     Breath sounds: Normal breath sounds.     Comments: No increased work of breathing.  Speaks in full sentences without difficulty. Abdominal:     Palpations: Abdomen is soft.     Tenderness: There is no abdominal tenderness. There is no guarding.  Musculoskeletal:     Cervical back: Neck supple.     Right lower leg: No edema.     Left lower leg: No edema.  Lymphadenopathy:     Cervical: No cervical adenopathy.  Skin:    General: Skin is warm and dry.  Neurological:     Mental Status: She is alert.  Psychiatric:        Mood and Affect: Mood and affect normal.        Speech: Speech normal.        Behavior: Behavior normal.     ED Results / Procedures / Treatments   Labs (all labs ordered are listed, but only abnormal results are displayed) Labs Reviewed  SARS CORONAVIRUS 2 BY RT PCR (HOSPITAL ORDER, PERFORMED IN [facility]) - Abnormal; Notable for the following components:      Result Value   SARS Coronavirus 2 POSITIVE (*)    All other components within normal limits  CBC WITH DIFFERENTIAL/PLATELET - Abnormal; Notable for the following components:   RBC 5.52 (*)    MCH 25.7 (*)    All other components within normal limits  COMPREHENSIVE  METABOLIC PANEL - Abnormal; Notable for the following components:   CO2 19 (*)    Glucose, Bld 112 (*)    Creatinine, Ser 1.09 (*)    Anion gap 16 (*)    All other components within normal limits  I-STAT BETA HCG BLOOD, ED ([facility], [facility], [facility] ONLY)    EKG EKG Interpretation  Date/Time:  Saturday April 13 2020 20:28:15 EDT Ventricular Rate:  128 PR Interval:  152 QRS Duration: 74 QT Interval:  292 QTC Calculation: 426 R Axis:   58 Text Interpretation: Sinus tachycardia no acute ST/T changes No old tracing to compare Confirmed by Pricilla Loveless [phone]) on 04/13/2020 9:34:11 PM   Radiology DG Chest Portable 1 View  Result Date: 04/13/2020 CLINICAL DATA:  Dyspnea EXAM: PORTABLE CHEST 1 VIEW COMPARISON:  None. FINDINGS: Patchy bibasilar pulmonary infiltrates are present, more prevalent at the left lung base, likely infectious or inflammatory in the acute setting. No pneumothorax or pleural effusion. Cardiac size within normal limits. The pulmonary vascularity is normal. No acute bone abnormality. IMPRESSION: Bibasilar pulmonary infiltrates, likely infectious or inflammatory. Electronically Signed   By: Helyn Numbers MD   On: 04/13/2020 21:32    Procedures Procedures (including critical care time)  Medications Ordered in ED Medications  acetaminophen (TYLENOL) tablet 1,000 mg (1,000 mg Oral Given 04/13/20 2231)  lactated ringers bolus 1,000 mL (0 mLs Intravenous Stopped 04/14/20 0213)  ondansetron (ZOFRAN) injection 4 mg (4 mg Intravenous Given 04/13/20 2335)  albuterol (VENTOLIN HFA) 108 (90 Base) MCG/ACT inhaler 2 puff (2 puffs Inhalation Given 04/13/20 2334)  dexamethasone (DECADRON) injection 10 mg (10 mg Intravenous Given 04/13/20 2335)    ED Course  I have reviewed the triage vital signs and the nursing notes.  Pertinent labs & imaging results  that were available during my care of the patient were reviewed by me and considered in my medical decision making (see chart for  details).    MDM Rules/Calculators/A&P                          Patient presents with shortness of breath in the setting of COVID-19. Initially febrile and therefore tachycardic. Patient is nontoxic appearing, not tachypneic, not hypotensive, maintains excellent SPO2 on room air, and is in no apparent distress.   I have reviewed the patient's chart to obtain more information.   I reviewed and interpreted the patient's labs and radiological studies. Mild decrease in CO2 with mild increases in creatinine and anion gap.  Suspect this is due to dehydration from poor oral intake. Patient's progression of symptoms is routinely found to be the case with COVID-19 infections. Patient voiced significant improvement with treatments here in the ED.  Patient is out of the window for consideration of MAB infusion.  The patient was given instructions for home care as well as return precautions. Patient voices understanding of these instructions, accepts the plan, and is comfortable with discharge.    Vitals:   04/14/20 0130 04/14/20 0145 04/14/20 0200 04/14/20 0223  BP: 115/83 113/83 114/85   Pulse: 99 96 95   Resp:      Temp:    98.3 F (36.8 C)  TempSrc:    Oral  SpO2: 96% 97% 97%   Weight:      Height:       Amanda Sanchez was evaluated in Emergency Department on 04/14/2020 for the symptoms described in the history of present illness. She was evaluated in the context of the global COVID-19 pandemic, which necessitated consideration that the patient might be at risk for infection with the SARS-CoV-2 virus that causes COVID-19. Institutional protocols and algorithms that pertain to the evaluation of patients at risk for COVID-19 are in a state of rapid change based on information released by regulatory bodies including the CDC and federal and state organizations. These policies and algorithms were followed during the patient's care in the ED.  Final Clinical Impression(s) / ED  Diagnoses Final diagnoses:  COVID-19  Shortness of breath    Rx / DC Orders ED Discharge Orders         Ordered    ondansetron (ZOFRAN ODT) 4 MG disintegrating tablet  Every 8 hours PRN        04/14/20 0208           Anselm PancoastJoy, Yoshika Vensel C, PA-C 04/14/20 [id number]    Gilda CreasePollina, Christopher J, MD 04/15/20 [phone]

## 2020-12-26 ENCOUNTER — Other Ambulatory Visit: Payer: Self-pay | Admitting: Orthopedic Surgery

## 2020-12-26 DIAGNOSIS — M25561 Pain in right knee: Secondary | ICD-10-CM

## 2020-12-26 DIAGNOSIS — M1711 Unilateral primary osteoarthritis, right knee: Secondary | ICD-10-CM

## 2021-01-05 ENCOUNTER — Ambulatory Visit
Admission: RE | Admit: 2021-01-05 | Discharge: 2021-01-05 | Disposition: A | Discharge status: Home / Self Care | Payer: Medicaid Other | Source: Ambulatory Visit | Attending: Orthopedic Surgery | Admitting: Orthopedic Surgery

## 2021-01-05 ENCOUNTER — Other Ambulatory Visit: Payer: Self-pay

## 2021-01-05 DIAGNOSIS — M25561 Pain in right knee: Secondary | ICD-10-CM | POA: Diagnosis not present

## 2021-01-05 DIAGNOSIS — M1711 Unilateral primary osteoarthritis, right knee: Secondary | ICD-10-CM | POA: Diagnosis present

## 2021-01-06 ENCOUNTER — Ambulatory Visit: Admission: RE | Admit: 2021-01-06 | Payer: Medicaid Other | Source: Ambulatory Visit

## 2021-04-29 ENCOUNTER — Other Ambulatory Visit: Payer: Self-pay | Admitting: Orthopedic Surgery

## 2021-05-02 ENCOUNTER — Encounter
Admission: RE | Admit: 2021-05-02 | Discharge: 2021-05-02 | Disposition: A | Discharge status: Home / Self Care | Payer: Medicaid Other | Source: Ambulatory Visit | Attending: Orthopedic Surgery | Admitting: Orthopedic Surgery

## 2021-05-02 ENCOUNTER — Other Ambulatory Visit: Payer: Self-pay

## 2021-05-02 HISTORY — DX: Gastro-esophageal reflux disease without esophagitis: K21.9

## 2021-05-02 NOTE — Patient Instructions (Signed)
Your procedure is scheduled on: 05/08/21 Report to DAY SURGERY DEPARTMENT LOCATED ON 2ND FLOOR MEDICAL MALL ENTRANCE. To find out your arrival time please call [phone] between 1PM - 3PM on 05/07/21.  Remember: Instructions that are not followed completely may result in serious medical risk, up to and including death, or upon the discretion of your surgeon and anesthesiologist your surgery may need to be rescheduled.     _X__ 1. Do not eat food after midnight the night before your procedure.                 No gum chewing or hard candies. You may drink clear liquids up to 2 hours                 before you are scheduled to arrive for your surgery- DO not drink clear                 liquids within 2 hours of the start of your surgery.                 Clear Liquids include:  water, apple juice without pulp, clear carbohydrate                 drink such as Clearfast or Gatorade, Black Coffee or Tea (Do not add                 anything to coffee or tea). Diabetics water only  THE ENSURE PRE SURGERY DRINK PROVIDED SHOULD BE COMPLETED 2 HOURS PRIOR TO ARRIVAL THE DAY OF SURGERY  __X__2.  On the morning of surgery brush your teeth with toothpaste and water, you                 may rinse your mouth with mouthwash if you wish.  Do not swallow any              toothpaste of mouthwash.     _X__ 3.  No Alcohol for 24 hours before or after surgery.   _X__ 4.  Do Not Smoke or use e-cigarettes For 24 Hours Prior to Your Surgery.                 Do not use any chewable tobacco products for at least 6 hours prior to                 surgery.  ____  5.  Bring all medications with you on the day of surgery if instructed.   __X__  6.  Notify your doctor if there is any change in your medical condition      (cold, fever, infections).     Do not wear jewelry, make-up, hairpins, clips or nail polish. Do not wear lotions, powders, or perfumes. Deodorant ok Do not shave body hair 48 hours prior to  surgery. Men may shave face and neck. Do not bring valuables to the hospital.  (Purse money credit cards)  [facility] is not responsible for any belongings or valuables.  Contacts, dentures/partials or body piercings may not be worn into surgery. Bring a case for your contacts, glasses or hearing aids, a denture cup will be supplied. Leave your suitcase in the car. After surgery it may be brought to your room. For patients admitted to the hospital, discharge time is determined by your treatment team.   Patients discharged the day of surgery will not be allowed to drive home.   Please read over the following fact  sheets that you were given:   MRSA Information, CHG soap, Incentive Spirometer, Ensure drink  __X__ Take these medicines the morning of surgery with A SIP OF WATER:    1. pantoprazole (PROTONIX) 40 MG tablet  2. topiramate (TOPAMAX) 50 MG tablet  3.   4.  5.  6.  ____ Fleet Enema (as directed)   __X__ Use CHG Soap/SAGE wipes as directed  ____ Use inhalers on the day of surgery  ____ Stop metformin/Janumet/Farxiga 2 days prior to surgery    ____ Take 1/2 of usual insulin dose the night before surgery. No insulin the morning          of surgery.   ____ Stop Blood Thinners Coumadin/Plavix/Xarelto/Pleta/Pradaxa/Eliquis/Effient/Aspirin  on   Or contact your Surgeon, Cardiologist or Medical Doctor regarding  ability to stop your blood thinners  __X__ Stop Anti-inflammatories 7 days before surgery such as Advil, Ibuprofen, Motrin,  BC or Goodies Powder, Naprosyn, Naproxen, Aleve, Aspirin    __X__ Stop all herbal supplements, fish oil or vitamin E until after surgery.    ____ Bring C-Pap to the hospital.    How to Use an Incentive Spirometer An incentive spirometer is a tool that measures how well you are filling your lungs with each breath. Learning to take long, deep breaths using this tool can help you keep your lungs clear and active. This may help to reverse or  lessen your chance of developing breathing (pulmonary) problems, especially infection. You may be asked to use a spirometer: After a surgery. If you have a lung problem or a history of smoking. After a long period of time when you have been unable to move or be active. If the spirometer includes an indicator to show the highest number that you have reached, your health care provider or respiratory therapist will help you set a goal. Keep a log of your progress as told by your health care provider. What are the risks? Breathing too quickly may cause dizziness or cause you to pass out. Take your time so you do not get dizzy or light-headed. If you are in pain, you may need to take pain medicine before doing incentive spirometry. It is harder to take a deep breath if you are having pain. How to use your incentive spirometer  Sit up on the edge of your bed or on a chair. Hold the incentive spirometer so that it is in an upright position. Before you use the spirometer, breathe out normally. Place the mouthpiece in your mouth. Make sure your lips are closed tightly around it. Breathe in slowly and as deeply as you can through your mouth, causing the piston or the ball to rise toward the top of the chamber. Hold your breath for 3-5 seconds, or for as long as possible. If the spirometer includes a coach indicator, use this to guide you in breathing. Slow down your breathing if the indicator goes above the marked areas. Remove the mouthpiece from your mouth and breathe out normally. The piston or ball will return to the bottom of the chamber. Rest for a few seconds, then repeat the steps 10 or more times. Take your time and take a few normal breaths between deep breaths so that you do not get dizzy or light-headed. Do this every 1-2 hours when you are awake. If the spirometer includes a goal marker to show the highest number you have reached (best effort), use this as a goal to work toward during each  repetition. After  each set of 10 deep breaths, cough a few times. This will help to make sure that your lungs are clear. If you have an incision on your chest or abdomen from surgery, place a pillow or a rolled-up towel firmly against the incision when you cough. This can help to reduce pain while taking deep breaths and coughing. General tips When you are able to get out of bed: Walk around often. Continue to take deep breaths and cough in order to clear your lungs. Keep using the incentive spirometer until your health care provider says it is okay to stop using it. If you have been in the hospital, you may be told to keep using the spirometer at home. Contact a health care provider if: You are having difficulty using the spirometer. You have trouble using the spirometer as often as instructed. Your pain medicine is not giving enough relief for you to use the spirometer as told. You have a fever. Get help right away if: You develop shortness of breath. You develop a cough with bloody mucus from the lungs. You have fluid or blood coming from an incision site after you cough. Summary An incentive spirometer is a tool that can help you learn to take long, deep breaths to keep your lungs clear and active. You may be asked to use a spirometer after a surgery, if you have a lung problem or a history of smoking, or if you have been inactive for a long period of time. Use your incentive spirometer as instructed every 1-2 hours while you are awake. If you have an incision on your chest or abdomen, place a pillow or a rolled-up towel firmly against your incision when you cough. This will help to reduce pain. Get help right away if you have shortness of breath, you cough up bloody mucus, or blood comes from your incision when you cough. This information is not intended to replace advice given to you by your health care provider. Make sure you discuss any questions you have with your health care  provider. Document Revised: 10/02/2019 Document Reviewed: 10/02/2019 Elsevier Patient Education  2022 [employer].

## 2021-05-07 MED ORDER — CEFAZOLIN SODIUM-DEXTROSE 2-4 GM/100ML-% IV SOLN
2.0000 g | INTRAVENOUS | Status: AC
  Administered 2021-05-08: 2 g via INTRAVENOUS

## 2021-05-07 MED ORDER — LACTATED RINGERS IV SOLN: INTRAVENOUS | Status: DC

## 2021-05-07 MED ORDER — CHLORHEXIDINE GLUCONATE 0.12 % MT SOLN: 15.0000 mL | Freq: Once | OROMUCOSAL | Status: AC

## 2021-05-07 MED ORDER — ORAL CARE MOUTH RINSE: 15.0000 mL | Freq: Once | OROMUCOSAL | Status: AC

## 2021-05-08 ENCOUNTER — Ambulatory Visit: Payer: Medicaid Other | Admitting: Certified Registered Nurse Anesthetist

## 2021-05-08 ENCOUNTER — Encounter: Payer: Self-pay | Admitting: Orthopedic Surgery

## 2021-05-08 ENCOUNTER — Encounter
Admission: RE | Disposition: A | Discharge status: Home / Self Care | Payer: Self-pay | Source: Ambulatory Visit | Attending: Orthopedic Surgery

## 2021-05-08 ENCOUNTER — Other Ambulatory Visit: Payer: Self-pay

## 2021-05-08 ENCOUNTER — Ambulatory Visit
Admission: RE | Admit: 2021-05-08 | Discharge: 2021-05-08 | Disposition: A | Discharge status: Home / Self Care | Payer: Medicaid Other | Source: Ambulatory Visit | Attending: Orthopedic Surgery | Admitting: Orthopedic Surgery

## 2021-05-08 DIAGNOSIS — S83001A Unspecified subluxation of right patella, initial encounter: Secondary | ICD-10-CM | POA: Diagnosis not present

## 2021-05-08 DIAGNOSIS — Z79899 Other long term (current) drug therapy: Secondary | ICD-10-CM | POA: Insufficient documentation

## 2021-05-08 DIAGNOSIS — Z87891 Personal history of nicotine dependence: Secondary | ICD-10-CM | POA: Diagnosis not present

## 2021-05-08 DIAGNOSIS — Z9884 Bariatric surgery status: Secondary | ICD-10-CM | POA: Diagnosis not present

## 2021-05-08 DIAGNOSIS — X58XXXA Exposure to other specified factors, initial encounter: Secondary | ICD-10-CM | POA: Insufficient documentation

## 2021-05-08 DIAGNOSIS — S83241A Other tear of medial meniscus, current injury, right knee, initial encounter: Secondary | ICD-10-CM | POA: Insufficient documentation

## 2021-05-08 DIAGNOSIS — M1711 Unilateral primary osteoarthritis, right knee: Secondary | ICD-10-CM | POA: Diagnosis not present

## 2021-05-08 HISTORY — PX: KNEE ARTHROSCOPY WITH MEDIAL MENISECTOMY: SHX5651

## 2021-05-08 SURGERY — ARTHROSCOPY, KNEE, WITH MEDIAL MENISCECTOMY
Anesthesia: General | Site: Knee | Laterality: Right

## 2021-05-08 MED ORDER — ACETAMINOPHEN 325 MG PO TABS: 325.0000 mg | ORAL_TABLET | Freq: Four times a day (QID) | ORAL | Status: DC | PRN

## 2021-05-08 MED ORDER — ONDANSETRON HCL 4 MG/2ML IJ SOLN
INTRAMUSCULAR | Status: DC | PRN
  Administered 2021-05-08: 4 mg via INTRAVENOUS

## 2021-05-08 MED ORDER — PROPOFOL 10 MG/ML IV BOLUS
INTRAVENOUS | Status: DC | PRN
  Administered 2021-05-08: 180 mg via INTRAVENOUS

## 2021-05-08 MED ORDER — FENTANYL CITRATE (PF) 100 MCG/2ML IJ SOLN
25.0000 ug | INTRAMUSCULAR | Status: DC | PRN
  Administered 2021-05-08 (×3): 25 ug via INTRAVENOUS

## 2021-05-08 MED ORDER — ACETAMINOPHEN 10 MG/ML IV SOLN
INTRAVENOUS | Status: AC
  Filled 2021-05-08: qty 100

## 2021-05-08 MED ORDER — DEXMEDETOMIDINE (PRECEDEX) IN NS 20 MCG/5ML (4 MCG/ML) IV SYRINGE
PREFILLED_SYRINGE | INTRAVENOUS | Status: DC | PRN
  Administered 2021-05-08: 8 ug via INTRAVENOUS

## 2021-05-08 MED ORDER — ACETAMINOPHEN 10 MG/ML IV SOLN
INTRAVENOUS | Status: DC | PRN
  Administered 2021-05-08: 1000 mg via INTRAVENOUS

## 2021-05-08 MED ORDER — HYDROCODONE-ACETAMINOPHEN 5-325 MG PO TABS
ORAL_TABLET | ORAL | Status: AC
  Filled 2021-05-08: qty 1

## 2021-05-08 MED ORDER — MIDAZOLAM HCL 2 MG/2ML IJ SOLN
INTRAMUSCULAR | Status: AC
  Filled 2021-05-08: qty 2

## 2021-05-08 MED ORDER — GLYCOPYRROLATE 0.2 MG/ML IJ SOLN
INTRAMUSCULAR | Status: AC
  Filled 2021-05-08: qty 1

## 2021-05-08 MED ORDER — SODIUM CHLORIDE 0.9 % IV SOLN: INTRAVENOUS | Status: DC

## 2021-05-08 MED ORDER — CEFAZOLIN SODIUM-DEXTROSE 2-4 GM/100ML-% IV SOLN
INTRAVENOUS | Status: AC
  Filled 2021-05-08: qty 100

## 2021-05-08 MED ORDER — BUPIVACAINE-EPINEPHRINE (PF) 0.5% -1:200000 IJ SOLN
INTRAMUSCULAR | Status: AC
  Filled 2021-05-08: qty 30

## 2021-05-08 MED ORDER — MIDAZOLAM HCL 2 MG/2ML IJ SOLN
INTRAMUSCULAR | Status: DC | PRN
  Administered 2021-05-08: 2 mg via INTRAVENOUS

## 2021-05-08 MED ORDER — MORPHINE SULFATE (PF) 2 MG/ML IV SOLN: 0.5000 mg | INTRAVENOUS | Status: DC | PRN

## 2021-05-08 MED ORDER — FENTANYL CITRATE (PF) 100 MCG/2ML IJ SOLN
INTRAMUSCULAR | Status: AC
  Administered 2021-05-08: 25 ug via INTRAVENOUS
  Filled 2021-05-08: qty 2

## 2021-05-08 MED ORDER — ONDANSETRON HCL 4 MG PO TABS: 4.0000 mg | ORAL_TABLET | Freq: Four times a day (QID) | ORAL | Status: DC | PRN

## 2021-05-08 MED ORDER — ONDANSETRON HCL 4 MG/2ML IJ SOLN: 4.0000 mg | Freq: Once | INTRAMUSCULAR | Status: DC | PRN

## 2021-05-08 MED ORDER — DEXAMETHASONE SODIUM PHOSPHATE 10 MG/ML IJ SOLN
INTRAMUSCULAR | Status: DC | PRN
  Administered 2021-05-08: 10 mg via INTRAVENOUS

## 2021-05-08 MED ORDER — FENTANYL CITRATE (PF) 100 MCG/2ML IJ SOLN
INTRAMUSCULAR | Status: AC
  Filled 2021-05-08: qty 2

## 2021-05-08 MED ORDER — HYDROCODONE-ACETAMINOPHEN 5-325 MG PO TABS: 1.0000 | ORAL_TABLET | ORAL | Status: DC | PRN

## 2021-05-08 MED ORDER — LIDOCAINE HCL (CARDIAC) PF 100 MG/5ML IV SOSY
PREFILLED_SYRINGE | INTRAVENOUS | Status: DC | PRN
  Administered 2021-05-08: 100 mg via INTRAVENOUS

## 2021-05-08 MED ORDER — HYDROCODONE-ACETAMINOPHEN 7.5-325 MG PO TABS: 1.0000 | ORAL_TABLET | ORAL | Status: DC | PRN

## 2021-05-08 MED ORDER — HYDROCODONE-ACETAMINOPHEN 5-325 MG PO TABS: 1.0000 | ORAL_TABLET | Freq: Four times a day (QID) | ORAL | Status: DC | PRN

## 2021-05-08 MED ORDER — BUPIVACAINE-EPINEPHRINE (PF) 0.5% -1:200000 IJ SOLN
INTRAMUSCULAR | Status: DC | PRN
  Administered 2021-05-08: 30 mL

## 2021-05-08 MED ORDER — ONDANSETRON HCL 4 MG/2ML IJ SOLN: 4.0000 mg | Freq: Four times a day (QID) | INTRAMUSCULAR | Status: DC | PRN

## 2021-05-08 MED ORDER — ONDANSETRON HCL 4 MG/2ML IJ SOLN
INTRAMUSCULAR | Status: AC
  Filled 2021-05-08: qty 2

## 2021-05-08 MED ORDER — CHLORHEXIDINE GLUCONATE 0.12 % MT SOLN
OROMUCOSAL | Status: AC
  Administered 2021-05-08: 15 mL via OROMUCOSAL
  Filled 2021-05-08: qty 15

## 2021-05-08 MED ORDER — LACTATED RINGERS IR SOLN
Status: DC | PRN
  Administered 2021-05-08 (×2): 3000 mL

## 2021-05-08 MED ORDER — DEXAMETHASONE SODIUM PHOSPHATE 10 MG/ML IJ SOLN
INTRAMUSCULAR | Status: AC
  Filled 2021-05-08: qty 1

## 2021-05-08 MED ORDER — FENTANYL CITRATE (PF) 100 MCG/2ML IJ SOLN
INTRAMUSCULAR | Status: DC | PRN
  Administered 2021-05-08 (×2): 50 ug via INTRAVENOUS

## 2021-05-08 MED ORDER — LIDOCAINE HCL (PF) 2 % IJ SOLN
INTRAMUSCULAR | Status: AC
  Filled 2021-05-08: qty 5

## 2021-05-08 MED ORDER — HYDROCODONE-ACETAMINOPHEN 5-325 MG PO TABS: 1.0000 | ORAL_TABLET | Freq: Four times a day (QID) | ORAL | 0 refills | Status: DC | PRN

## 2021-05-08 SURGICAL SUPPLY — 30 items
BLADE INCISOR PLUS 4.5 (BLADE) IMPLANT
BNDG ELASTIC 4X5.8 VLCR STR LF (GAUZE/BANDAGES/DRESSINGS) IMPLANT
CHLORAPREP W/TINT 26 (MISCELLANEOUS) ×2 IMPLANT
CUFF TOURN SGL QUICK 24 (TOURNIQUET CUFF)
CUFF TOURN SGL QUICK 34 (TOURNIQUET CUFF)
CUFF TRNQT CYL 24X4X16.5-23 (TOURNIQUET CUFF) IMPLANT
CUFF TRNQT CYL 34X4.125X (TOURNIQUET CUFF) IMPLANT
DRAPE C-ARMOR (DRAPES) IMPLANT
GAUZE SPONGE 4X4 12PLY STRL (GAUZE/BANDAGES/DRESSINGS) ×2 IMPLANT
GLOVE SURG SYN 9.0  PF PI (GLOVE) ×1
GLOVE SURG SYN 9.0 PF PI (GLOVE) ×1 IMPLANT
GOWN SRG 2XL LVL 4 RGLN SLV (GOWNS) ×1 IMPLANT
GOWN STRL NON-REIN 2XL LVL4 (GOWNS) ×1
GOWN STRL REUS W/ TWL LRG LVL3 (GOWN DISPOSABLE) ×2 IMPLANT
GOWN STRL REUS W/TWL LRG LVL3 (GOWN DISPOSABLE) ×2
IV LACTATED RINGER IRRG 3000ML (IV SOLUTION) ×2
IV LR IRRIG 3000ML ARTHROMATIC (IV SOLUTION) ×2 IMPLANT
KIT TURNOVER KIT A (KITS) ×2 IMPLANT
MANIFOLD NEPTUNE II (INSTRUMENTS) ×2 IMPLANT
NEEDLE HYPO 22GX1.5 SAFETY (NEEDLE) ×2 IMPLANT
PACK ARTHROSCOPY KNEE (MISCELLANEOUS) ×2 IMPLANT
SCALPEL PROTECTED #11 DISP (BLADE) ×2 IMPLANT
SPONGE T-LAP 18X18 ~~LOC~~+RFID (SPONGE) ×2 IMPLANT
SUT ETHILON 4-0 (SUTURE) ×1
SUT ETHILON 4-0 FS2 18XMFL BLK (SUTURE) ×1
SUTURE ETHLN 4-0 FS2 18XMF BLK (SUTURE) ×1 IMPLANT
TUBING INFLOW SET DBFLO PUMP (TUBING) ×2 IMPLANT
TUBING OUTFLOW SET DBLFO PUMP (TUBING) ×2 IMPLANT
WAND COBLATION FLOW 50 (SURGICAL WAND) IMPLANT
WATER STERILE IRR 500ML POUR (IV SOLUTION) ×2 IMPLANT

## 2021-05-08 NOTE — Anesthesia Postprocedure Evaluation (Signed)
Anesthesia Post Note  Patient: Amanda Sanchez  Procedure(s) Performed: Right knee arthroscopy,partial medial and lateral meniscectomy (Right: Knee)  Patient location during evaluation: PACU Anesthesia Type: General Level of consciousness: awake and alert Pain management: pain level controlled Vital Signs Assessment: post-procedure vital signs reviewed and stable Respiratory status: spontaneous breathing, nonlabored ventilation, respiratory function stable and patient connected to nasal cannula oxygen Cardiovascular status: blood pressure returned to baseline and stable Postop Assessment: no apparent nausea or vomiting Anesthetic complications: no   No notable events documented.   Last Vitals:  Vitals:   05/08/21 1119 05/08/21 1137  BP: 106/72 124/79  Pulse: 65 74  Resp: 14 14  Temp:  (!) 36.1 C  SpO2: 99% 100%    Last Pain:  Vitals:   05/08/21 1137  TempSrc:   PainSc: 2                  Yevette Edwards

## 2021-05-08 NOTE — Discharge Instructions (Addendum)
Take it easy through the weekend Try not to walk too much Keep leg elevated on pillow Aspirin 81 mg daily Pain medicine as directed Keep dressing clean and dry  AMBULATORY SURGERY  DISCHARGE INSTRUCTIONS   The drugs that you were given will stay in your system until tomorrow so for the next 24 hours you should not:  Drive an automobile Make any legal decisions Drink any alcoholic beverage   You may resume regular meals tomorrow.  Today it is better to start with liquids and gradually work up to solid foods.  You may eat anything you prefer, but it is better to start with liquids, then soup and crackers, and gradually work up to solid foods.   Please notify your doctor immediately if you have any unusual bleeding, trouble breathing, redness and pain at the surgery site, drainage, fever, or pain not relieved by medication.    Additional Instructions:        Please contact your physician with any problems or Same Day Surgery at [phone], Monday through Friday 6 am to 4 pm, or [place] at [facility] number at [phone].

## 2021-05-08 NOTE — H&P (Signed)
Chief Complaint  Patient presents with   Right Knee - Pain    History of the Present Illness: Amanda Sanchez is a 40 y.o. female here today.   The patient presents for evaluation of right knee osteoarthritis, as well as MRI showing a medial meniscal tear. The patient states the unloader brace is not working. She is interested in knee arthroscopy. The patient is emotional in clinic today, and states she has had right knee pain every day for 6 years.  I have reviewed past medical, surgical, social and family history, and allergies as documented in the EMR.  Past Medical History: Past Medical History:  Diagnosis Date   Anxiety   Borderline diabetic   Depression   Hyperlipidemia  Diet controlled, no meds   Low iron   Past Surgical History: Past Surgical History:  Procedure Laterality Date   CESAREAN DELIVERY Bilateral 09/30/2018  Procedure: CESAREAN DELIVERY ONLY; INCLUDING POSTPARTUM CARE; Surgeon: Nestor Ramp, MD; Location: [facility]; Service: Obstetrics; Laterality: Bilateral;   CESAREAN SECTION 12/21/2014   CESAREAN SECTION 06/2009   LAPAROSCOPIC GASTRIC BYPASS. ESOPHAGOSCOPY / EGD 01/23/2020  Marlis Edelson, MD   Wisdom Teeth Removed   Past Family History: Family History  Problem Relation Age of Onset   Fibroids Mother   Hyperlipidemia (Elevated cholesterol) Father   Diabetes Maternal Grandfather   High blood pressure (Hypertension) Maternal Grandfather   High blood pressure (Hypertension) Paternal Grandmother   High blood pressure (Hypertension) Paternal Grandfather   Diabetes Paternal Grandfather   Medications: Current Outpatient Medications Ordered in Epic  Medication Sig Dispense Refill   acetaminophen (TYLENOL) 500 MG tablet Take by mouth Take 1,000 mg by mouth as needed for Pain.   phentermine (ADIPEX-P) 37.5 mg tablet TAKE 1 TABLET BY MOUTH ONCE DAILY BEFORE BREAKFAST   PREVIDENT 5000 BOOSTER PLUS 1.1 % dental paste as directed    topiramate (TOPAMAX) 25 MG tablet Take by mouth   diclofenac (VOLTAREN) 1 % topical gel Apply 4 g topically 4 (four) times daily 100 g 2   No current Epic-ordered facility-administered medications on file.   Allergies: No Known Allergies   Body mass index is 34.76 kg/m.  Review of Systems: A comprehensive 14 point ROS was performed, reviewed, and the pertinent orthopaedic findings are documented in the HPI.  Vitals:  04/23/21 0848  BP: 138/76    General Physical Examination:    General/Constitutional: No apparent distress: well-nourished and well developed. Eyes: Pupils equal, round with synchronous movement. Lungs: Clear to auscultation HEENT: Normal Vascular: No edema, swelling or tenderness, except as noted in detailed exam. Cardiac: Heart rate and rhythm is regular. Integumentary: No impressive skin lesions present, except as noted in detailed exam. Neuro/Psych: Normal mood and affect, oriented to person, place and time.  On exam, right knee range of motion is 10 to 100 degrees with varus deformity. Medial joint line tenderness and swelling. Lungs are clear. Heart rate and rhythm is normal. HEENT is normal.  Radiographs:  No new imaging studies were obtained or reviewed today.  Assessment: ICD-10-CM  1. Primary osteoarthritis of right knee M17.11  2. Derangement of posterior horn of medial meniscus of right knee due to old injury M23.221   Plan:  The patient has clinical findings of osteoarthritis and medial meniscus tear.  At her age, she would like to try arthroscopy first. I think that is reasonable with the extent of the medial meniscus tear. Arthroscopy video was reviewed. We will plan on doing that in  the near future.  Surgical Risks:  The nature of the condition and the proposed procedure has been reviewed in detail with the patient. Surgical versus non-surgical options and prognosis for recovery have been reviewed and the inherent risks and benefits of  each have been discussed including the risks of infection, bleeding, injury to nerves/blood vessels/tendons, incomplete relief of symptoms, persisting pain and/or stiffness, loss of function, complex regional pain syndrome, failure of the procedure, as appropriate.  Scribe Attestation: I, Dawn Royse, am acting as scribe for [employer], MD.   Electronically signed by Marlena Clipper, MD at 04/23/2021 7:30 PM EDT  Reviewed  H+P. No changes noted.

## 2021-05-08 NOTE — Anesthesia Preprocedure Evaluation (Signed)
Anesthesia Evaluation  Patient identified by MRN, date of birth, ID band Patient awake    Reviewed: Allergy & Precautions, H&P , NPO status , Patient's Chart, lab work & pertinent test results, reviewed documented beta blocker date and time   Airway Mallampati: II  TM Distance: >3 FB Neck ROM: full    Dental  (+) Teeth Intact   Pulmonary neg pulmonary ROS, former smoker,    Pulmonary exam normal        Cardiovascular Exercise Tolerance: Good negative cardio ROS Normal cardiovascular exam Rate:Normal     Neuro/Psych Anxiety negative neurological ROS  negative psych ROS   GI/Hepatic negative GI ROS, Neg liver ROS, GERD  Medicated,  Endo/Other  negative endocrine ROS  Renal/GU negative Renal ROS  negative genitourinary   Musculoskeletal   Abdominal   Peds  Hematology negative hematology ROS (+) Blood dyscrasia, anemia ,   Anesthesia Other Findings   Reproductive/Obstetrics negative OB ROS                             Anesthesia Physical Anesthesia Plan  ASA: 2  Anesthesia Plan: General LMA   Post-op Pain Management:    Induction:   PONV Risk Score and Plan: 4 or greater  Airway Management Planned:   Additional Equipment:   Intra-op Plan:   Post-operative Plan:   Informed Consent: I have reviewed the patients History and Physical, chart, labs and discussed the procedure including the risks, benefits and alternatives for the proposed anesthesia with the patient or authorized representative who has indicated his/her understanding and acceptance.       Plan Discussed with: CRNA  Anesthesia Plan Comments:         Anesthesia Quick Evaluation

## 2021-05-08 NOTE — Anesthesia Procedure Notes (Signed)
Procedure Name: LMA Insertion Date/Time: 05/08/2021 9:44 AM Performed by: Jenkins Rouge, RN Pre-anesthesia Checklist: Patient identified, Patient being monitored, Timeout performed, Emergency Drugs available and Suction available Patient Re-evaluated:Patient Re-evaluated prior to induction Oxygen Delivery Method: Circle system utilized Preoxygenation: Pre-oxygenation with 100% oxygen Induction Type: IV induction Ventilation: Mask ventilation without difficulty LMA: LMA inserted LMA Size: 3.5 Number of attempts: 1 Placement Confirmation: positive ETCO2 and breath sounds checked- equal and bilateral Tube secured with: Tape Dental Injury: Teeth and Oropharynx as per pre-operative assessment

## 2021-05-08 NOTE — Transfer of Care (Signed)
Immediate Anesthesia Transfer of Care Note  Patient: Amanda Sanchez  Procedure(s) Performed: Right knee arthroscopy,partial medial and lateral meniscectomy (Right: Knee)  Patient Location: PACU  Anesthesia Type:General  Level of Consciousness: sedated  Airway & Oxygen Therapy: Patient Spontanous Breathing and Patient connected to face mask oxygen  Post-op Assessment: Report given to RN and Post -op Vital signs reviewed and stable  Post vital signs: Reviewed and stable  Last Vitals:  Vitals Value Taken Time  BP 91/63   Temp    Pulse 69 05/08/21 1023  Resp 10 05/08/21 1023  SpO2 100 % 05/08/21 1023  Vitals shown include unvalidated device data.  Last Pain:  Vitals:   05/08/21 0831  TempSrc: Temporal  PainSc: 0-No pain         Complications: No notable events documented.

## 2021-05-08 NOTE — Op Note (Signed)
05/08/2021  10:19 AM  PATIENT:  Amanda Sanchez  40 y.o. female  PRE-OPERATIVE DIAGNOSIS:  Primary osteoarthritis of right knee  M17.11 Derangement of posterior horn of medial meniscus of right knee due to old injury  M23.221  POST-OPERATIVE DIAGNOSIS:  Primary osteoarthritis of right knee  M17.11, patellar subluxation, medial meniscus tear  PROCEDURE: Partial medial meniscectomy and lateral release  SURGEON: Leitha Schuller, MD  ASSISTANTS: None  ANESTHESIA:   general  EBL:  Total I/O In: 100 [IV Piggyback:100] Out: -   BLOOD ADMINISTERED:none  DRAINS: none   LOCAL MEDICATIONS USED:  MARCAINE     SPECIMEN:  No Specimen  DISPOSITION OF SPECIMEN:  N/A  COUNTS:  YES  TOURNIQUET:   Total Tourniquet Time Documented: Thigh (Right) - 14 minutes Total: Thigh (Right) - 14 minutes   IMPLANTS: None  DICTATION: .Dragon Dictation patient brought the operating room and after adequate general anesthesia was obtained the right leg was placed uroscopic leg holder with a tourniquet applied.  After prepping and draping in the usual sterile manner appropriate patient identification timeout procedures were completed.  An inferior lateral portal was made and the arthroscope was introduced.  Initial inspection revealed significant patellofemoral degenerative changes with some areas of exposed bone in the trochlea.  The there was significant subluxation of the patella.  Tourniquet was raised at this point because of some excessive bleeding.  Coming on the medial compartment inferior medial portal was made and on probing there is a complex tear of the medial meniscus but the posterior horn being displaceable into the joint and partially extruded as well.  There is also some exposed bone in the medial femoral condyle and tibia.  The ACL was intact and lateral compartment was essentially normal.  ArthroCare wand was used to ablate the medial meniscus tear to a stable margin.  A lateral release was  then carried out with much better tracking the patella.  The gutters were checked there are no loose bodies there is small plica superiorly which was ablated and the knee was thoroughly irrigated till clear.  The instruments were removed and the wounds injected with half percent Sensorcaine along with the area of the lateral release for postop analgesia.  Wounds were closed using simple erupted 4-0 nylon skin suture.  Xeroform 4 x 4 ABD web roll and Ace wrap applied.  PLAN OF CARE: Discharge to home after PACU  PATIENT DISPOSITION:  PACU - hemodynamically stable.

## 2021-08-13 ENCOUNTER — Emergency Department
Admission: EM | Admit: 2021-08-13 | Discharge: 2021-08-13 | Disposition: A | Discharge status: Home / Self Care | Payer: No Typology Code available for payment source | Attending: Emergency Medicine | Admitting: Emergency Medicine

## 2021-08-13 ENCOUNTER — Other Ambulatory Visit: Payer: Self-pay

## 2021-08-13 DIAGNOSIS — T148XXA Other injury of unspecified body region, initial encounter: Secondary | ICD-10-CM

## 2021-08-13 DIAGNOSIS — Y92410 Unspecified street and highway as the place of occurrence of the external cause: Secondary | ICD-10-CM | POA: Diagnosis not present

## 2021-08-13 DIAGNOSIS — S34109A Unspecified injury to unspecified level of lumbar spinal cord, initial encounter: Secondary | ICD-10-CM | POA: Diagnosis present

## 2021-08-13 DIAGNOSIS — S161XXA Strain of muscle, fascia and tendon at neck level, initial encounter: Secondary | ICD-10-CM | POA: Diagnosis not present

## 2021-08-13 DIAGNOSIS — S39012A Strain of muscle, fascia and tendon of lower back, initial encounter: Secondary | ICD-10-CM | POA: Insufficient documentation

## 2021-08-13 MED ORDER — CYCLOBENZAPRINE HCL 10 MG PO TABS: 10.0000 mg | ORAL_TABLET | Freq: Three times a day (TID) | ORAL | 0 refills | Status: AC | PRN

## 2021-08-13 MED ORDER — LIDOCAINE 5 % EX PTCH
1.0000 | MEDICATED_PATCH | CUTANEOUS | Status: DC
  Administered 2021-08-13: 1 via TRANSDERMAL
  Filled 2021-08-13: qty 1

## 2021-08-13 MED ORDER — ACETAMINOPHEN 500 MG PO TABS
1000.0000 mg | ORAL_TABLET | Freq: Once | ORAL | Status: AC
  Administered 2021-08-13: 1000 mg via ORAL
  Filled 2021-08-13: qty 2
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
## 2022-10-07 IMAGING — MR MR KNEE*R* W/O CM
6 series · 40 of 40 positions shown · non-contrast
Comparison: None.

CLINICAL DATA: Chronic right knee pain. No injury or prior surgery.

EXAM:
MRI OF THE RIGHT KNEE WITHOUT CONTRAST
TECHNIQUE: Multiplanar, multisequence MR imaging of the knee was performed. No
intravenous contrast was administered.

[Series 8: T2 fat-sat · axial · right · 4.0mm · 0.50mm/px · z∈[-69,+56]mm · 6 of 26 slices shown (1 of 3)]
[im 1/26]
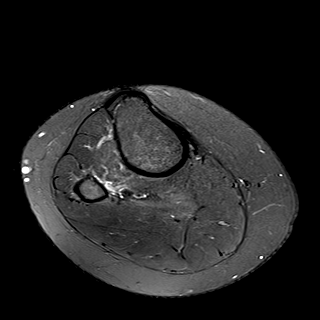
[im 6/26]
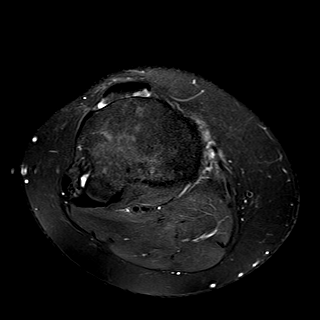
[im 11/26]
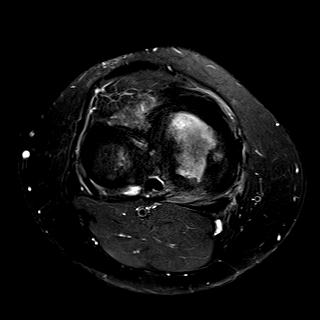
[im 16/26]
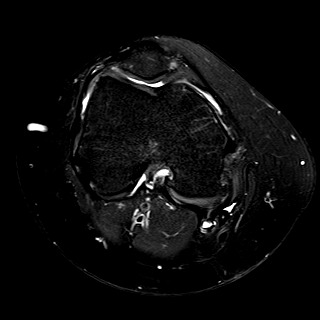
[im 21/26]
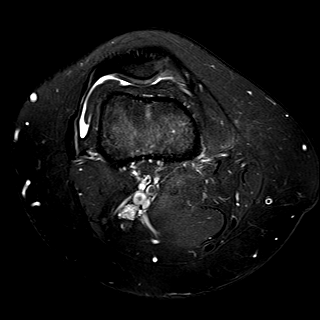
[im 26/26]
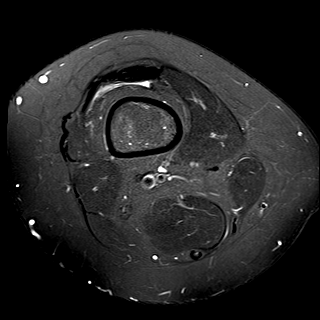

[Series 9: T2 fat-sat · coronal · right · 4.0mm · 0.59mm/px · 6 of 29 slices shown (2 of 3)]
[im 1/29]
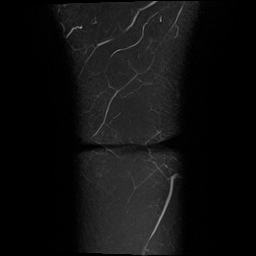
[im 6/29]
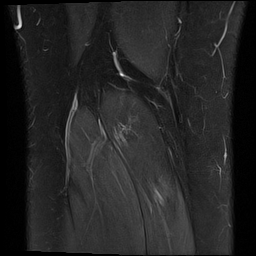
[im 12/29]
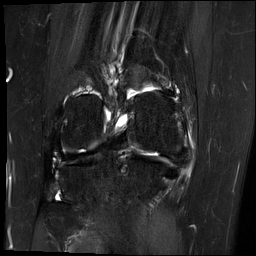
[im 17/29]
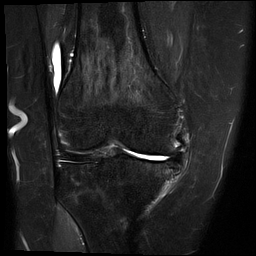
[im 23/29]
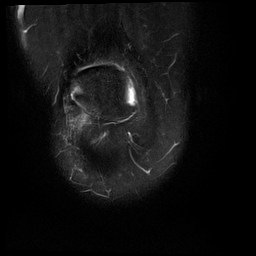
[im 29/29]
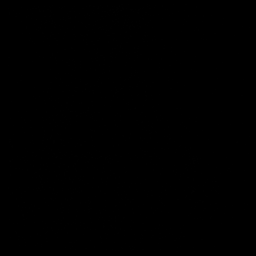

[Series 10: T1 · coronal · right · 4.0mm · 0.59mm/px · 7 of 30 slices shown]
[im 1/30]
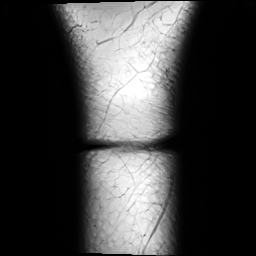
[im 5/30]
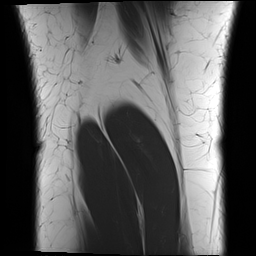
[im 10/30]
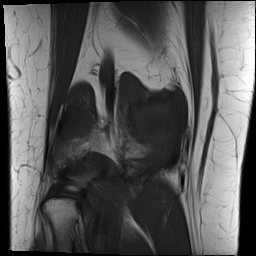
[im 15/30]
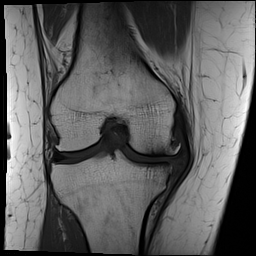
[im 20/30]
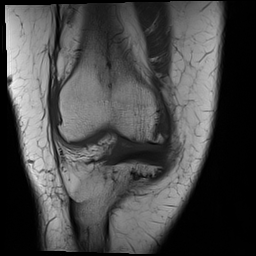
[im 25/30]
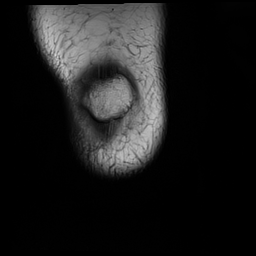
[im 30/30]
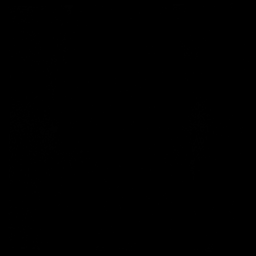

[Series 11: PD fat-sat · coronal · right · 4.0mm · 0.59mm/px · 6 of 29 slices shown (1 of 2)]
[im 1/29]
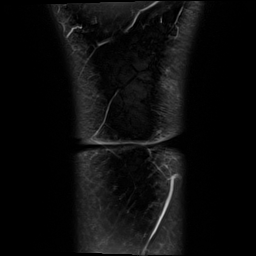
[im 6/29]
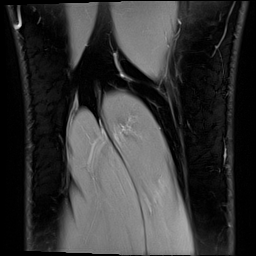
[im 12/29]
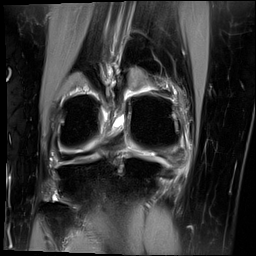
[im 17/29]
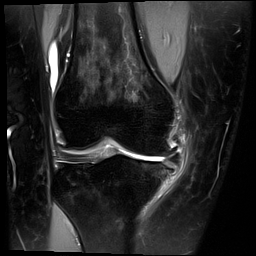
[im 23/29]
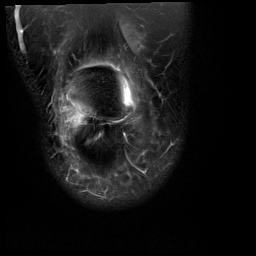
[im 29/29]
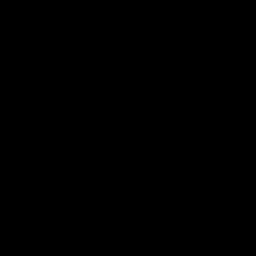

[Series 12: PD fat-sat · sagittal · right · 3.0mm · 0.59mm/px · 7 of 33 slices shown (2 of 2)]
[im 1/33]
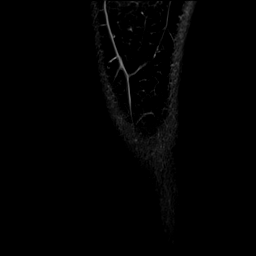
[im 6/33]
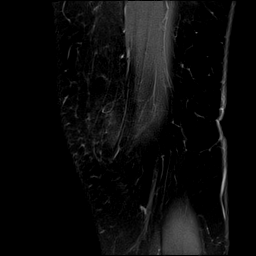
[im 11/33]
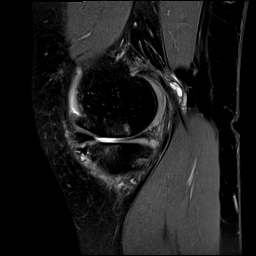
[im 17/33]
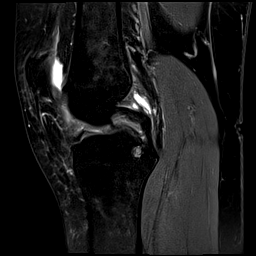
[im 22/33]
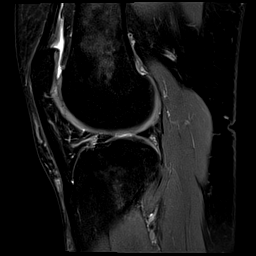
[im 27/33]
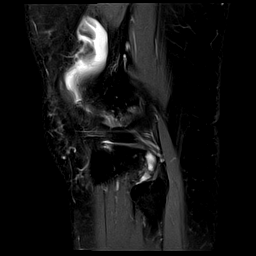
[im 33/33]
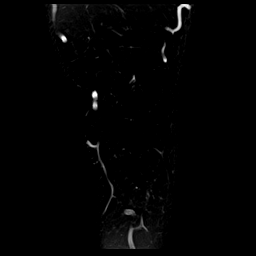

[Series 13: T2 fat-sat · sagittal · right · 3.0mm · 0.59mm/px · 8 of 36 slices shown (3 of 3)]
[im 1/36]
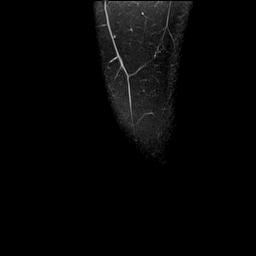
[im 6/36]
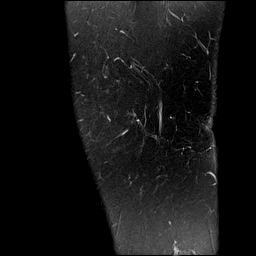
[im 11/36]
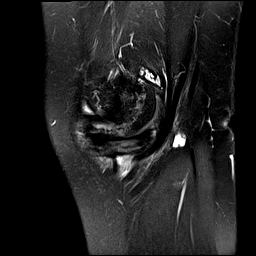
[im 16/36]
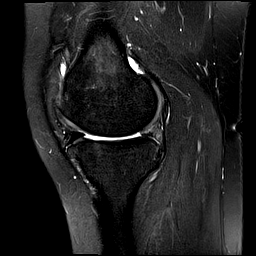
[im 21/36]
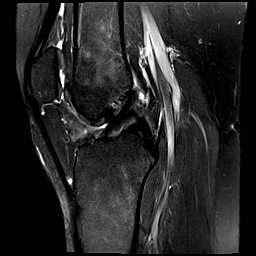
[im 26/36]
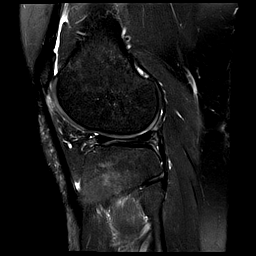
[im 31/36]
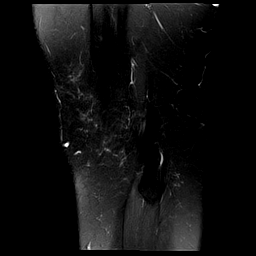
[im 36/36]
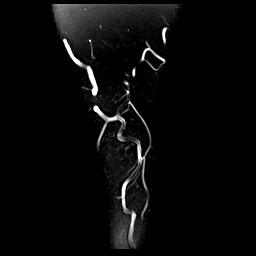

[40 of 40 positions shown; findings below may reference images not displayed]

FINDINGS: MENISCI

Medial meniscus: Complex tear of the body and posterior horn with
extrusion of the body.

Lateral meniscus:  Intact.

LIGAMENTS

Cruciates:  Intact ACL and PCL.

Collaterals: Medial collateral ligament is intact. Lateral
collateral ligament complex is intact.

CARTILAGE

Patellofemoral:  Mild partial-thickness cartilage loss.

Medial: Large area of full-thickness cartilage loss over the central
weight-bearing medial femoral condyle and anterior medial tibial
plateau with trace subchondral marrow edema.

Lateral: Small partial-thickness cartilage defect over the central
weight-bearing lateral femoral condyle. 5 x 4 mm full-thickness
cartilage defect over the posterior weight-bearing lateral femoral
condyle.

Joint: Trace joint effusion. Mild edema within the superolateral
aspect of Hoffa's fat.

Popliteal Fossa:  Trace Baker cyst.  Intact popliteus tendon.

Extensor Mechanism: Intact quadriceps tendon and patellar tendon.
Intact medial and lateral patellar retinaculum. Intact MPFL.

Bones: No acute fracture or dislocation. No suspicious bone lesion.
Small intraosseous ganglion cyst in the posterior tibial spine.
Tricompartmental marginal osteophytes.

Other: None.
IMPRESSION: 1. Complex tear of the medial meniscus body and posterior horn with
extrusion of the body.
2. Tricompartmental osteoarthritis, moderate in the medial
compartment.
3. Focal abnormal edema in the superolateral aspect of Hoffa's fat
pad, which can be seen in the setting of patellar tendon-lateral
femoral condyle friction syndrome.

## 2023-02-05 ENCOUNTER — Encounter (HOSPITAL_COMMUNITY): Payer: Self-pay

## 2023-02-05 ENCOUNTER — Emergency Department (HOSPITAL_COMMUNITY)
Admission: EM | Admit: 2023-02-05 | Discharge: 2023-02-05 | Disposition: A | Discharge status: Home / Self Care | Payer: Medicaid Other | Attending: Emergency Medicine | Admitting: Emergency Medicine

## 2023-02-05 ENCOUNTER — Other Ambulatory Visit: Payer: Self-pay

## 2023-02-05 DIAGNOSIS — R0602 Shortness of breath: Secondary | ICD-10-CM | POA: Insufficient documentation

## 2023-02-05 DIAGNOSIS — R112 Nausea with vomiting, unspecified: Secondary | ICD-10-CM | POA: Diagnosis not present

## 2023-02-05 LAB — COMPREHENSIVE METABOLIC PANEL
ALT: 11 U/L (ref 0–44)
AST: 14 U/L — ABNORMAL LOW (ref 15–41)
Albumin: 3.9 g/dL (ref 3.5–5.0)
Alkaline Phosphatase: 64 U/L (ref 38–126)
Anion gap: 9 (ref 5–15)
BUN: 11 mg/dL (ref 6–20)
CO2: 24 mmol/L (ref 22–32)
Calcium: 8.8 mg/dL — ABNORMAL LOW (ref 8.9–10.3)
Chloride: 102 mmol/L (ref 98–111)
Creatinine, Ser: 0.78 mg/dL (ref 0.44–1.00)
GFR, Estimated: >60 mL/min (ref >60)
Glucose, Bld: 110 mg/dL — ABNORMAL HIGH (ref 70–99)
Potassium: 3.8 mmol/L (ref 3.5–5.1)
Sodium: 135 mmol/L (ref 135–145)
Total Bilirubin: 0.9 mg/dL (ref 0.3–1.2)
Total Protein: 7.6 g/dL (ref 6.5–8.1)

## 2023-02-05 LAB — CBC
HCT: 37.6 % (ref 36.0–46.0)
Hemoglobin: 11.8 g/dL — ABNORMAL LOW (ref 12.0–15.0)
MCH: 24.1 pg — ABNORMAL LOW (ref 26.0–34.0)
MCHC: 31.4 g/dL (ref 30.0–36.0)
MCV: 76.9 fL — ABNORMAL LOW (ref 80.0–100.0)
Platelets: 307 10*3/uL (ref 150–400)
RBC: 4.89 MIL/uL (ref 3.87–5.11)
RDW: 16.4 % — ABNORMAL HIGH (ref 11.5–15.5)
WBC: 7 10*3/uL (ref 4.0–10.5)
nRBC: 0 % (ref 0.0–0.2)

## 2023-02-05 LAB — LIPASE, BLOOD: Lipase: 28 U/L (ref 11–51)

## 2023-02-05 MED ORDER — LIDOCAINE VISCOUS HCL 2 % MT SOLN
15.0000 mL | Freq: Once | OROMUCOSAL | Status: AC
  Administered 2023-02-05: 15 mL via ORAL
  Filled 2023-02-05: qty 15

## 2023-02-05 MED ORDER — FAMOTIDINE IN NACL 20-0.9 MG/50ML-% IV SOLN
20.0000 mg | Freq: Once | INTRAVENOUS | Status: AC
  Administered 2023-02-05: 20 mg via INTRAVENOUS
  Filled 2023-02-05: qty 50

## 2023-02-05 MED ORDER — SODIUM CHLORIDE 0.9 % IV BOLUS
1000.0000 mL | Freq: Once | INTRAVENOUS | Status: AC
  Administered 2023-02-05: 1000 mL via INTRAVENOUS

## 2023-02-05 MED ORDER — ONDANSETRON HCL 4 MG/2ML IJ SOLN
4.0000 mg | Freq: Once | INTRAMUSCULAR | Status: AC
  Administered 2023-02-05: 4 mg via INTRAVENOUS
  Filled 2023-02-05: qty 2

## 2023-02-05 MED ORDER — ALUM & MAG HYDROXIDE-SIMETH 200-200-20 MG/5ML PO SUSP
30.0000 mL | Freq: Once | ORAL | Status: AC
  Administered 2023-02-05: 30 mL via ORAL
  Filled 2023-02-05: qty 30

## 2023-02-05 MED ORDER — METOCLOPRAMIDE HCL 5 MG/ML IJ SOLN
10.0000 mg | Freq: Once | INTRAMUSCULAR | Status: AC
  Administered 2023-02-05: 10 mg via INTRAVENOUS
  Filled 2023-02-05: qty 2

## 2023-02-05 MED ORDER — PROMETHAZINE HCL 25 MG RE SUPP: 25.0000 mg | Freq: Four times a day (QID) | RECTAL | 0 refills | Status: AC | PRN

## 2023-02-05 MED ORDER — PANTOPRAZOLE SODIUM 40 MG IV SOLR
40.0000 mg | Freq: Once | INTRAVENOUS | Status: AC
  Administered 2023-02-05: 40 mg via INTRAVENOUS
  Filled 2023-02-05: qty 10

## 2023-02-05 NOTE — ED Provider Notes (Signed)
[place] EMERGENCY DEPARTMENT AT [facility] Provider Note   CSN: [id number] Arrival date & time: 02/05/23  8657     History  Chief Complaint  Patient presents with   Emesis   Nausea    Amanda Sanchez is a 42 y.o. female.  With history of GERD, gastric ulcers, hyperlipidemia, anemia, gastric bypass on 12/2019 who presents to the ED for evaluation of nausea and vomiting.  Symptoms began last night and got worse this morning.  She states she has not been able to take her home medications today.  She has vomited numerous times and states she does not have any more substances left in her stomach to throw up.  She states she developed some shortness of breath when she has her episodes of emesis.  She denies any hematemesis.  No chest pain.  She reports very mild if any abdominal pain.  No dysuria, frequency or urgency.  No fevers or chills.   Emesis      Home Medications Prior to Admission medications   Medication Sig Start Date End Date Taking? Authorizing Provider  promethazine (PHENERGAN) 25 MG suppository Place 1 suppository (25 mg total) rectally every 6 (six) hours as needed for nausea or vomiting. 02/05/23  Yes Madylin Fairbank, Edsel Petrin, PA-C  acetaminophen (TYLENOL) 500 MG tablet Take 1,000 mg by mouth every 6 (six) hours as needed for moderate pain or headache.    [provider]  Cholecalciferol (VITAMIN D) 50 MCG (2000 UT) CAPS Take 4,000 Units by mouth daily.    [provider]  diclofenac Sodium (VOLTAREN) 1 % GEL Apply 4 g topically 4 (four) times daily. 04/23/21   [provider]  HYDROcodone-acetaminophen (NORCO) 5-325 MG tablet Take 1-2 tablets by mouth every 6 (six) hours as needed for moderate pain. 05/08/21   Kennedy Bucker, MD  pantoprazole (PROTONIX) 40 MG tablet Take 40 mg by mouth daily. 03/12/21   [provider]  phentermine 15 MG capsule Take 15 mg by mouth in the morning.    [provider]  topiramate (TOPAMAX)  50 MG tablet Take 50 mg by mouth daily. 07/24/19   [provider]  triamcinolone ointment (KENALOG) 0.5 % Apply 1 application topically 3 (three) times daily as needed (eczema).    [provider]      Allergies    Patient has no known allergies.    Review of Systems   Review of Systems  Gastrointestinal:  Positive for nausea and vomiting.  All other systems reviewed and are negative.   Physical Exam Updated Vital Signs BP (!) 146/104   Pulse 91   Temp 98 F (36.7 C)   Resp 18   SpO2 99%  Physical Exam Vitals and nursing note reviewed.  Constitutional:      General: She is not in acute distress.    Appearance: She is well-developed.     Comments: Resting comfortably in bed  HENT:     Head: Normocephalic and atraumatic.  Eyes:     Conjunctiva/sclera: Conjunctivae normal.  Cardiovascular:     Rate and Rhythm: Normal rate and regular rhythm.     Heart sounds: No murmur heard. Pulmonary:     Effort: Pulmonary effort is normal. No respiratory distress.     Breath sounds: Normal breath sounds. No wheezing, rhonchi or rales.  Abdominal:     Palpations: Abdomen is soft.     Tenderness: There is no abdominal tenderness. There is no guarding.  Musculoskeletal:  General: No swelling.     Cervical back: Neck supple.  Skin:    General: Skin is warm and dry.     Capillary Refill: Capillary refill takes less than 2 seconds.  Neurological:     General: No focal deficit present.     Mental Status: She is alert and oriented to person, place, and time.  Psychiatric:        Mood and Affect: Mood normal.     ED Results / Procedures / Treatments   Labs (all labs ordered are listed, but only abnormal results are displayed) Labs Reviewed  COMPREHENSIVE METABOLIC PANEL - Abnormal; Notable for the following components:      Result Value   Glucose, Bld 110 (*)    Calcium 8.8 (*)    AST 14 (*)    All other components within normal limits  CBC - Abnormal;  Notable for the following components:   Hemoglobin 11.8 (*)    MCV 76.9 (*)    MCH 24.1 (*)    RDW 16.4 (*)    All other components within normal limits  LIPASE, BLOOD    EKG None  Radiology No results found.  Procedures Procedures    Medications Ordered in ED Medications  ondansetron (ZOFRAN) injection 4 mg (4 mg Intravenous Given 02/05/23 1125)  sodium chloride 0.9 % bolus 1,000 mL (0 mLs Intravenous Stopped 02/05/23 1505)  famotidine (PEPCID) IVPB 20 mg premix (0 mg Intravenous Stopped 02/05/23 1234)  alum & mag hydroxide-simeth (MAALOX/MYLANTA) 200-200-20 MG/5ML suspension 30 mL (30 mLs Oral Given 02/05/23 1239)    And  lidocaine (XYLOCAINE) 2 % viscous mouth solution 15 mL (15 mLs Oral Given 02/05/23 1239)  metoCLOPramide (REGLAN) injection 10 mg (10 mg Intravenous Given 02/05/23 1335)  pantoprazole (PROTONIX) injection 40 mg (40 mg Intravenous Given 02/05/23 1335)    ED Course/ Medical Decision Making/ A&P Clinical Course as of 02/05/23 1653  Fri Feb 05, 2023  1436 Patient still feeling nauseous.  Will attempt p.o. challenge.  May need admission for intractable nausea and vomiting [AS]  1625 Patient able to tolerate p.o. intake.  Would prefer to be discharged home at this time. [AS]    Clinical Course User Index [AS] Izaya Netherton, Edsel Petrin, PA-C                             Medical Decision Making Amount and/or Complexity of Data Reviewed Labs: ordered.  Risk OTC drugs. Prescription drug management.  This patient presents to the ED for concern of nausea and vomiting, this involves an extensive number of treatment options, and is a complaint that carries with it a high risk of complications and morbidity. The emergent differential diagnosis for vomiting includes, but is not limited to ACS/MI, DKA, Ischemic bowel, Meningitis, Sepsis, Acute gastric dilation, Adrenal insufficiency, Appendicitis,  Bowel obstruction/ileus, Carbon monoxide poisoning, Cholecystitis, Electrolyte  abnormalities, Elevated ICP, Gastric outlet obstruction, Pancreatitis, Ruptured viscus, Biliary colic, Cannabinoid hyperemesis syndrome, Gastritis, Gastroenteritis, Gastroparesis,  Narcotic withdrawal, Peptic ulcer disease, and UTI   Co morbidities that complicate the patient evaluation  GERD, gastric ulcers, hyperlipidemia, anemia  My initial workup includes labs, symptom control, EKG  Additional history obtained from: Nursing notes from this visit. Previous records within EMR system office visit on 03/11/2021, follow-up on gastric bypass.  Was not taking her PPI at that time  I ordered, reviewed and interpreted labs which include: CBC, CMP, lipase, urinalysis, urine pregnancy.  Hyperglycemia 110, borderline  anemia with hemoglobin of 11.8.  No leukocytosis.  No electrolyte derangements.  Cardiac Monitoring:  The patient was maintained on a cardiac monitor.  I personally viewed and interpreted the cardiac monitored which showed an underlying rhythm of: NSR  Afebrile, hypertensive but otherwise hemodynamically stable.  42 year old female presenting to the ED for evaluation of nausea and vomiting.  She is status post gastric bypass surgery in 12/2019.  She believes her symptoms are secondary to dyspepsia.  She has been unable to take her home medications today.  She was treated with IV fluids, multiple rounds of antiemetics, Pepcid, Protonix, and a GI cocktail and reported continued symptoms.  She was initially unable to tolerate p.o. intake after antiemetics, however she was then later able to tolerate some crackers and ginger ale.  Her lab workup was overall reassuring today.  She also has a reassuring physical exam.  She is in no acute distress.  She has a soft and nontender abdomen.  She does not appear clinically dehydrated.  I had a shared decision-making conversation with the patient regarding admission to the hospital for intractable nausea versus discharge home.  Patient states she would  prefer to be discharged home so she can follow-up with her surgical team at [facility].  Believe this is reasonable as she has tolerated oral intake here. Unclear etiology of her symptoms, however gastroenteritis is high on the differential. She was sent a prescription for rectal Phenergan as her oral Zofran did not work.  She was encouraged to follow-up with her surgical team as soon as possible.  She was given strict return precautions.  Stable discharge.  At this time there does not appear to be any evidence of an acute emergency medical condition and the patient appears stable for discharge with appropriate outpatient follow up. Diagnosis was discussed with patient who verbalizes understanding of care plan and is agreeable to discharge. I have discussed return precautions with patient who verbalizes understanding. Patient encouraged to follow-up with their PCP as soon as possible. All questions answered.  Note: Portions of this report may have been transcribed using voice recognition software. Every effort was made to ensure accuracy; however, inadvertent computerized transcription errors may still be present.        Final Clinical Impression(s) / ED Diagnoses Final diagnoses:  Nausea and vomiting in adult    Rx / DC Orders ED Discharge Orders          Ordered    promethazine (PHENERGAN) 25 MG suppository  Every 6 hours PRN        02/05/23 1627              Mora Bellman 02/05/23 1653    Rondel Baton, MD 02/10/23 1255

## 2023-02-05 NOTE — ED Triage Notes (Signed)
Pt endorses cold chills, nausea, emesis x 1 day.  Hx of ulcers. Feels like she is having palpitations, SHOB.  Unable to tolerate PO liquids or solids.   Took zofran 0300 and ulcer medication, did not keep these meds down.

## 2023-02-05 NOTE — Discharge Instructions (Addendum)
You have been seen today for your complaint of nausea and vomiting. Your lab work was overall reassuring. Your discharge medications include rectal Phenergan.  This is a medicine you can use if you are unable to tolerate any medications by mouth. Follow up with: Your surgical team as soon as possible Please seek immediate medical care if you develop any of the following symptoms: You have pain in your chest, neck, arm, or jaw. You feel extremely weak or you faint. You have persistent vomiting. You have vomit that is bright red or looks like black coffee grounds. You have bloody or black stools (feces) or stools that look like tar. You have a severe headache, a stiff neck, or both. You have severe pain, cramping, or bloating in your abdomen. You have difficulty breathing, or you are breathing very quickly. Your heart is beating very quickly. Your skin feels cold and clammy. You feel confused. You have signs of dehydration, such as: Dark urine, very little urine, or no urine. Cracked lips. Dry mouth. Sunken eyes. Sleepiness. Weakness. At this time there does not appear to be the presence of an emergent medical condition, however there is always the potential for conditions to change. Please read and follow the below instructions.  Do not take your medicine if  develop an itchy rash, swelling in your mouth or lips, or difficulty breathing; call 911 and seek immediate emergency medical attention if this occurs.  You may review your lab tests and imaging results in their entirety on your MyChart account.  Please discuss all results of fully with your primary care provider and other specialist at your follow-up visit.  Note: Portions of this text may have been transcribed using voice recognition software. Every effort was made to ensure accuracy; however, inadvertent computerized transcription errors may still be present.

## 2023-04-27 NOTE — Progress Notes (Signed)
Office Visit Note  Patient: Amanda Sanchez             Date of Birth: 27-Nov-1980           MRN: [id number]             PCP: Franciso Bend, NP Referring: Shelly Bombard Visit Date: 05/11/2023 Occupation: @GUAROCC @  Subjective:  Pain in multiple joints  History of Present Illness: Amanda Sanchez is a 42 y.o. female seen in consultation per request of [facility].  According to the patient she always had popping in her right knee since she was in middle school.  She related to being overweight.  In 2016 while she was pregnant she had sudden right knee joint pain to the point she could not walk for 2 to 3 days.  At the time she was seen by orthopedic surgery but nothing was advised.  She thought her symptoms were related to being overweight.  She had gastric bypass surgery on February 22, 2020.  She had intentional weight loss.  3 years ago she was seen by orthopedic surgeon at [facility] and had MRI.  She was told that she had torn meniscus.  She had several cortisone injections and also arthroscopic surgery without much relief.  She has had viscosupplement injections which do not last for more than 2 months.  She has been told that she has severe osteoarthritis and she was suggested partial knee replacement which is scheduled in December 2024.  She states right knee joint catches off-and-on.  Recently she has been having pain and discomfort in her left knee joint as well.  She has had cortisone injections.  She chronic discomfort in her lower back.  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  None of the other joints are painful or swollen.  History of osteoarthritis in her mother and maternal grandmother.  She is gravida 4, para 3, miscarriage 1.  There is no history of preeclampsia or DVTs.    Activities of Daily Living:  Patient reports morning stiffness for all day.  Patient Reports nocturnal pain.  Difficulty  dressing/grooming: Reports Difficulty climbing stairs: Reports Difficulty getting out of chair: Reports Difficulty using hands for taps, buttons, cutlery, and/or writing: Denies  Review of Systems  Constitutional:  Positive for fatigue.  HENT:  Negative for mouth sores and mouth dryness.   Eyes:  Negative for dryness.  Respiratory:  Negative for shortness of breath.   Cardiovascular:  Negative for chest pain and palpitations.  Gastrointestinal:  Negative for blood in stool, constipation and diarrhea.  Endocrine: Negative for increased urination.  Genitourinary:  Negative for painful urination and involuntary urination.  Musculoskeletal:  Positive for joint pain, gait problem, joint pain, myalgias, muscle weakness, morning stiffness, muscle tenderness and myalgias. Negative for joint swelling.  Skin:  Negative for color change, rash, hair loss and sensitivity to sunlight.  Allergic/Immunologic: Negative for susceptible to infections.  Neurological:  Negative for dizziness and headaches.  Hematological:  Negative for swollen glands.  Psychiatric/Behavioral:  Positive for sleep disturbance. Negative for depressed mood. The patient is not nervous/anxious.     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 09/30/2018   Unwanted fertility 05/25/2018   AMA (advanced maternal age) multigravida 35+ 05/25/2018   Supervision of normal pregnancy 05/02/2018   Rh negative  state in antepartum period 06/13/2014   History of cesarean delivery, currently pregnant 05/16/2014   Uterus didelphys    Candidal intertrigo 10/27/2011   Morbid obesity (HCC) 10/20/2011   Hyperlipidemia 10/20/2011    Past Medical History:  Diagnosis Date   Anemia    Anxiety    no meds   Complete bicornuate uterus    2 cervices   GERD (gastroesophageal reflux disease)    Heartburn in pregnancy    zantac prn   Hyperlipidemia     diet controlled, no med   Obesity    Uterus didelphys 2 cervices   Vaginal discharge during pregnancy in first trimester 05/02/2014   Yeast infection 05/02/2014    Family History  Problem Relation Age of Onset   Fibroids Mother    Arthritis Mother    Hyperlipidemia Father    Healthy Brother    Arthritis Maternal Grandmother    Diabetes Maternal Grandfather    Hypertension Maternal Grandfather    Diabetes Paternal Grandmother    Hypertension Paternal Grandmother    Diabetes Paternal Grandfather    Hypertension Paternal Grandfather    Healthy Son    Healthy Son    Healthy Son    Past Surgical History:  Procedure Laterality Date   CESAREAN SECTION  06/2009   [facility]   CESAREAN SECTION N/A 12/21/2014   Procedure: REPEAT CESAREAN SECTION;  Surgeon: Lazaro Arms, MD;  Location: [facility] ORS;  Service: Obstetrics;  Laterality: N/A;   CESAREAN SECTION  09/30/2018   [facility]   gastric bypass N/A 02/22/2020   KNEE ARTHROSCOPY WITH MEDIAL MENISECTOMY Right 05/08/2021   Procedure: Right knee arthroscopy,partial medial and lateral meniscectomy;  Surgeon: Kennedy Bucker, MD;  Location: [facility] ORS;  Service: Orthopedics;  Laterality: Right;   WISDOM TOOTH EXTRACTION     Social History   Social History Narrative   Not on file   Immunization History  Administered Date(s) Administered   Rho (D) Immune Globulin 09/15/2018   Tdap 12/22/2014, 09/06/2018, 02/17/2019     Objective: Vital Signs: BP 106/72 (BP Location: Right Arm, Patient Position: Sitting, Cuff Size: Normal)   Pulse 73   Resp 15   Ht 5\' 4"  (1.626 m)   Wt 188 lb 6.4 oz (85.5 kg)   BMI 32.34 kg/m    Physical Exam Vitals and nursing note reviewed.  Constitutional:      Appearance: She is well-developed.  HENT:     Head: Normocephalic and atraumatic.  Eyes:     Conjunctiva/sclera: Conjunctivae normal.  Cardiovascular:     Rate and Rhythm: Normal rate and regular rhythm.     Heart sounds: Normal heart sounds.  Pulmonary:      Effort: Pulmonary effort is normal.     Breath sounds: Normal breath sounds.  Abdominal:     General: Bowel sounds are normal.     Palpations: Abdomen is soft.  Musculoskeletal:     Cervical back: Normal range of motion.  Lymphadenopathy:     Cervical: No cervical adenopathy.  Skin:    General: Skin is warm and dry.     Capillary Refill: Capillary refill takes less than 2 seconds.  Neurological:     Mental Status: She is alert and oriented to person, place, and time.  Psychiatric:        Behavior: Behavior normal.      Musculoskeletal Exam: Cervical, thoracic and lumbar spine were in good range of motion.  She had discomfort in the lower lumbar region and over the  right SI joint.  Shoulders, elbows, wrist joints, MCPs PIPs and DIPs were in good range of motion with no synovitis.  Hip joints with good range of motion without any warmth swelling or effusion.  Bilateral knee joints with good range of motion without any warmth swelling or effusion.  She had discomfort with range of motion of bilateral knee joints.  There was no tenderness over ankles or MTPs.  Bilateral pes cavus and hammertoes were noted.  CDAI Exam: CDAI Score: -- Patient Global: --; Provider Global: -- Swollen: --; Tender: -- Joint Exam 05/11/2023   No joint exam has been documented for this visit   There is currently no information documented on the homunculus. Go to the Rheumatology activity and complete the homunculus joint exam.  Investigation: No additional findings.  Imaging: No results found.  Recent Labs: Lab Results  Component Value Date   WBC 7.0 02/05/2023   HGB 11.8 (L) 02/05/2023   PLT 307 02/05/2023   NA 135 02/05/2023   K 3.8 02/05/2023   CL 102 02/05/2023   CO2 24 02/05/2023   GLUCOSE 110 (H) 02/05/2023   BUN 11 02/05/2023   CREATININE 0.78 02/05/2023   BILITOT 0.9 02/05/2023   ALKPHOS 64 02/05/2023   AST 14 (L) 02/05/2023   ALT 11 02/05/2023   PROT 7.6 02/05/2023   ALBUMIN 3.9  02/05/2023   CALCIUM 8.8 (L) 02/05/2023   GFRAA >60 04/13/2020    Speciality Comments: No specialty comments available.  Procedures:  No procedures performed Allergies: Patient has no known allergies.   Assessment / Plan:     Visit Diagnoses: Polyarthralgia-patient was sent for evaluation of pain in multiple joints.  She gives history of pain and discomfort in the bilateral knee joints and lower back.  She denies any history of joint swelling.  Primary osteoarthritis of right knee-patient has been followed at [facility] for the last several years.  She states 3 years ago she was diagnosed with torn meniscus.  She underwent right knee joint arthroscopic surgery on May 08, 2021.  She has had cortisone injections in the past.  She is also had viscosupplement injections which last longer.  According to the patient she has been told that she has severe osteoarthritis of her right knee joint and she is scheduled to have a right partial knee replacement in December 2024.  No warmth swelling or effusion was noted today on the examination.  Chronic pain of left knee -she complains of pain and discomfort in her left knee joint.  No warmth swelling or effusion was noted.  She believes the pain is coming from favoring her right knee.  I will obtain x-rays and labs today.  A handout on lower extremity muscle strengthening exercises was given.  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  Sedimentation rate, Uric acid, Rheumatoid factor, Cyclic citrul peptide antibody, IgG  Chronic midline low back pain without sciatica -she complains of discomfort in her lower back and right SI joint.  She denies any radiculopathy.  She believes the pain in the lower back is related to the knee joint discomfort.  Plan: XR Lumbar Spine 2-3 Views.  X-rays showed facet joint arthropathy and L4-L5 spondylolisthesis.  Pes cavus of both feet-bilateral cavus  was noted.  Hammertoes were noted.  Use of arch support was advised.  Other medical problems listed as follows:  Mixed hyperlipidemia  History of gastroesophageal reflux (GERD)  History of gastric ulcer  S/P gastric bypass - January 14, 2020  Vitamin D deficiency-she takes vitamin D 2000 units daily.  Orders: Orders Placed This Encounter  Procedures   XR KNEE 3 VIEW LEFT   XR Lumbar Spine 2-3 Views   Sedimentation rate   Uric acid   Rheumatoid factor   Cyclic citrul peptide antibody, IgG   No orders of the defined types were placed in this encounter.    Follow-Up Instructions: Return for Osteoarthritis.   Pollyann Savoy, MD  Note - This record has been created using Animal nutritionist.  Chart creation errors have been sought, but may not always  have been located. Such creation errors do not reflect on  the standard of medical care.

## 2023-05-11 ENCOUNTER — Encounter: Payer: Self-pay | Admitting: Rheumatology

## 2023-05-11 ENCOUNTER — Ambulatory Visit: Payer: Medicaid Other

## 2023-05-11 ENCOUNTER — Ambulatory Visit: Payer: Medicaid Other | Attending: Rheumatology | Admitting: Rheumatology

## 2023-05-11 VITALS — BP 106/72 | HR 73 | Resp 15 | Ht 64.0 in | Wt 188.4 lb

## 2023-05-11 DIAGNOSIS — M1711 Unilateral primary osteoarthritis, right knee: Secondary | ICD-10-CM

## 2023-05-11 DIAGNOSIS — M2550 Pain in unspecified joint: Secondary | ICD-10-CM

## 2023-05-11 DIAGNOSIS — Z8711 Personal history of peptic ulcer disease: Secondary | ICD-10-CM

## 2023-05-11 DIAGNOSIS — G8929 Other chronic pain: Secondary | ICD-10-CM

## 2023-05-11 DIAGNOSIS — E782 Mixed hyperlipidemia: Secondary | ICD-10-CM

## 2023-05-11 DIAGNOSIS — Z8719 Personal history of other diseases of the digestive system: Secondary | ICD-10-CM

## 2023-05-11 DIAGNOSIS — M545 Low back pain, unspecified: Secondary | ICD-10-CM | POA: Diagnosis not present

## 2023-05-11 DIAGNOSIS — M25562 Pain in left knee: Secondary | ICD-10-CM

## 2023-05-11 DIAGNOSIS — Q6672 Congenital pes cavus, left foot: Secondary | ICD-10-CM

## 2023-05-11 DIAGNOSIS — E559 Vitamin D deficiency, unspecified: Secondary | ICD-10-CM

## 2023-05-11 DIAGNOSIS — Z9884 Bariatric surgery status: Secondary | ICD-10-CM

## 2023-05-11 DIAGNOSIS — Q6671 Congenital pes cavus, right foot: Secondary | ICD-10-CM

## 2023-05-11 NOTE — Patient Instructions (Signed)
Low Back Sprain or Strain Rehab Ask your health care provider which exercises are safe for you. Do exercises exactly as told by your health care provider and adjust them as directed. It is normal to feel mild stretching, pulling, tightness, or discomfort as you do these exercises. Stop right away if you feel sudden pain or your pain gets worse. Do not begin these exercises until told by your health care provider. Stretching and range-of-motion exercises These exercises warm up your muscles and joints and improve the movement and flexibility of your back. These exercises also help to relieve pain, numbness, and tingling. Lumbar rotation  Lie on your back on a firm bed or the floor with your knees bent. Straighten your arms out to your sides so each arm forms a 90-degree angle (right angle) with a side of your body. Slowly move (rotate) both of your knees to one side of your body until you feel a stretch in your lower back (lumbar). Try not to let your shoulders lift off the floor. Hold this position for __________ seconds. Tense your abdominal muscles and slowly move your knees back to the starting position. Repeat this exercise on the other side of your body. Repeat __________ times. Complete this exercise __________ times a day. Single knee to chest  Lie on your back on a firm bed or the floor with both legs straight. Bend one of your knees. Use your hands to move your knee up toward your chest until you feel a gentle stretch in your lower back and buttock. Hold your leg in this position by holding on to the front of your knee. Keep your other leg as straight as possible. Hold this position for __________ seconds. Slowly return to the starting position. Repeat with your other leg. Repeat __________ times. Complete this exercise __________ times a day. Prone extension on elbows  Lie on your abdomen on a firm bed or the floor (prone position). Prop yourself up on your elbows. Use your arms  to help lift your chest up until you feel a gentle stretch in your abdomen and your lower back. This will place some of your body weight on your elbows. If this is uncomfortable, try stacking pillows under your chest. Your hips should stay down, against the surface that you are lying on. Keep your hip and back muscles relaxed. Hold this position for __________ seconds. Slowly relax your upper body and return to the starting position. Repeat __________ times. Complete this exercise __________ times a day. Strengthening exercises These exercises build strength and endurance in your back. Endurance is the ability to use your muscles for a long time, even after they get tired. Pelvic tilt This exercise strengthens the muscles that lie deep in the abdomen. Lie on your back on a firm bed or the floor with your legs extended. Bend your knees so they are pointing toward the ceiling and your feet are flat on the floor. Tighten your lower abdominal muscles to press your lower back against the floor. This motion will tilt your pelvis so your tailbone points up toward the ceiling instead of pointing to your feet or the floor. To help with this exercise, you may place a small towel under your lower back and try to push your back into the towel. Hold this position for __________ seconds. Let your muscles relax completely before you repeat this exercise. Repeat __________ times. Complete this exercise __________ times a day. Alternating arm and leg raises  Get on your hands  and knees on a firm surface. If you are on a hard floor, you may want to use padding, such as an exercise mat, to cushion your knees. Line up your arms and legs. Your hands should be directly below your shoulders, and your knees should be directly below your hips. Lift your left leg behind you. At the same time, raise your right arm and straighten it in front of you. Do not lift your leg higher than your hip. Do not lift your arm higher  than your shoulder. Keep your abdominal and back muscles tight. Keep your hips facing the ground. Do not arch your back. Keep your balance carefully, and do not hold your breath. Hold this position for __________ seconds. Slowly return to the starting position. Repeat with your right leg and your left arm. Repeat __________ times. Complete this exercise __________ times a day. Abdominal set with straight leg raise  Lie on your back on a firm bed or the floor. Bend one of your knees and keep your other leg straight. Tense your abdominal muscles and lift your straight leg up, 4-6 inches (10-15 cm) off the ground. Keep your abdominal muscles tight and hold this position for __________ seconds. Do not hold your breath. Do not arch your back. Keep it flat against the ground. Keep your abdominal muscles tense as you slowly lower your leg back to the starting position. Repeat with your other leg. Repeat __________ times. Complete this exercise __________ times a day. Single leg lower with bent knees Lie on your back on a firm bed or the floor. Tense your abdominal muscles and lift your feet off the floor, one foot at a time, so your knees and hips are bent in 90-degree angles (right angles). Your knees should be over your hips and your lower legs should be parallel to the floor. Keeping your abdominal muscles tense and your knee bent, slowly lower one of your legs so your toe touches the ground. Lift your leg back up to return to the starting position. Do not hold your breath. Do not let your back arch. Keep your back flat against the ground. Repeat with your other leg. Repeat __________ times. Complete this exercise __________ times a day. Posture and body mechanics Good posture and healthy body mechanics can help to relieve stress in your body's tissues and joints. Body mechanics refers to the movements and positions of your body while you do your daily activities. Posture is part of body  mechanics. Good posture means: Your spine is in its natural S-curve position (neutral). Your shoulders are pulled back slightly. Your head is not tipped forward (neutral). Follow these guidelines to improve your posture and body mechanics in your everyday activities. Standing  When standing, keep your spine neutral and your feet about hip-width apart. Keep a slight bend in your knees. Your ears, shoulders, and hips should line up. When you do a task in which you stand in one place for a long time, place one foot up on a stable object that is 2-4 inches (5-10 cm) high, such as a footstool. This helps keep your spine neutral. Sitting  When sitting, keep your spine neutral and keep your feet flat on the floor. Use a footrest, if necessary, and keep your thighs parallel to the floor. Avoid rounding your shoulders, and avoid tilting your head forward. When working at a desk or a computer, keep your desk at a height where your hands are slightly lower than your elbows. Slide your  chair under your desk so you are close enough to maintain good posture. When working at a computer, place your monitor at a height where you are looking straight ahead and you do not have to tilt your head forward or downward to look at the screen. Resting When lying down and resting, avoid positions that are most painful for you. If you have pain with activities such as sitting, bending, stooping, or squatting, lie in a position in which your body does not bend very much. For example, avoid curling up on your side with your arms and knees near your chest (fetal position). If you have pain with activities such as standing for a long time or reaching with your arms, lie with your spine in a neutral position and bend your knees slightly. Try the following positions: Lying on your side with a pillow between your knees. Lying on your back with a pillow under your knees. Lifting  When lifting objects, keep your feet at least  shoulder-width apart and tighten your abdominal muscles. Bend your knees and hips and keep your spine neutral. It is important to lift using the strength of your legs, not your back. Do not lock your knees straight out. Always ask for help to lift heavy or awkward objects. This information is not intended to replace advice given to you by your health care provider. Make sure you discuss any questions you have with your health care provider. Document Revised: 11/16/2022 Document Reviewed: 09/30/2020 [employer] Patient Education  2024 [employer]  Exercises for Chronic Knee Pain Chronic knee pain is pain that lasts longer than 3 months. For most people with chronic knee pain, exercise and weight loss is an important part of treatment. Your health care provider may want you to focus on: Making the muscles that support your knee stronger. This can take pressure off your knee and reduce pain. Preventing knee stiffness. How far you can move your knee, keeping it there or making it farther. Losing weight (if this applies) to take pressure off your knee, lower your risk for injury, and make it easier for you to exercise. Your provider will help you make an exercise program that fits your needs and physical abilities. Below are simple, low-impact exercises you can do at home. Ask your provider or physical therapist how often you should do your exercise program and how many times to repeat each exercise. General safety tips  Get your provider's approval before doing any exercises. Start slowly and stop any time you feel pain. Do not exercise if your knee pain is flaring up. Warm up first. Stretching a cold muscle can cause an injury. Do 5-10 minutes of easy movement or light stretching before beginning your exercises. Do 5-10 minutes of low-impact activity (like walking or cycling) before starting strengthening exercises. Contact your provider any time you have pain during or after exercising. Exercise  can cause discomfort but should not be painful. It is normal to be a little stiff or sore after exercising. Stretching and range-of-motion exercises Front thigh stretch  Stand up straight and support your body by holding on to a chair or resting one hand on a wall. With your legs straight and close together, bend one knee to lift your heel up toward your butt. Using one hand for support, grab your ankle with your free hand. Pull your foot up closer toward your butt to feel the stretch in front of your thigh. Hold the stretch for 30 seconds. Repeat __________ times. Complete  this exercise __________ times a day. Back thigh stretch  Sit on the floor with your back straight and your legs out straight in front of you. Place the palms of your hands on the floor and slide them toward your feet as you bend at the hip. Try to touch your nose to your knees and feel the stretch in the back of your thighs. Hold for 30 seconds. Repeat __________ times. Complete this exercise __________ times a day. Calf stretch  Stand facing a wall. Place the palms of your hands flat against the wall, arms extended, and lean slightly against the wall. Get into a lunge position with one leg bent at the knee and the other leg stretched out straight behind you. Keep both feet facing the wall and increase the bend in your knee while keeping the heel of the other leg flat on the ground. You should feel the stretch in your calf. Hold for 30 seconds. Repeat __________ times. Complete this exercise __________ times a day. Strengthening exercises Straight leg lift  Lie on your back with one knee bent and the other leg out straight. Slowly lift the straight leg without bending the knee. Lift until your foot is about 12 inches (30 cm) off the floor. Hold for 3-5 seconds and slowly lower your leg. Repeat __________ times. Complete this exercise __________ times a day. Single leg dip  Stand between two chairs and put both  hands on the backs of the chairs for support. Extend one leg out straight with your body weight resting on the heel of the standing leg. Slowly bend your standing knee to dip your body to the level that is comfortable for you. Hold for 3-5 seconds. Repeat __________ times. Complete this exercise __________ times a day. Hamstring curls  Stand straight, knees close together, facing the back of a chair. Hold on to the back of a chair with both hands. Keep one leg straight. Bend the other knee while bringing the heel up toward the butt until the knee is bent at a 90-degree angle (right angle). Hold for 3-5 seconds. Repeat __________ times. Complete this exercise __________ times a day. Wall squat  Stand straight with your back, hips, and head against a wall. Step forward one foot at a time with your back still against the wall. Your feet should be 2 feet (61 cm) from the wall at shoulder width. Keeping your back, hips, and head against the wall, slide down the wall to as close to a sitting position as you can get. Hold for 5-10 seconds, then slowly slide back up. Repeat __________ times. Complete this exercise __________ times a day. Step-ups  Stand in front of a sturdy platform or stool that is about 6 inches (15 cm) high. Slowly step up with your left / right foot, keeping your knee in line with your hip and foot. Do not let your knee bend so far that you cannot see your toes. Hold on to a chair for balance, but do not use it for support. Slowly unlock your knee and lower yourself to the starting position. Repeat __________ times. Complete this exercise __________ times a day. Contact a health care provider if: Your exercises cause pain. Your pain is worse after you exercise. Your pain prevents you from doing your exercises. This information is not intended to replace advice given to you by your health care provider. Make sure you discuss any questions you have with your health care  provider. Document Revised: 07/28/2022 Document  Reviewed: 07/28/2022 [employer] Patient Education  2024 [employer].

## 2023-05-13 LAB — CYCLIC CITRUL PEPTIDE ANTIBODY, IGG: Cyclic Citrullin Peptide Ab: <16 U

## 2023-05-13 LAB — URIC ACID: Uric Acid, Serum: 3.1 mg/dL (ref 2.5–7.0)

## 2023-05-13 LAB — SEDIMENTATION RATE: Sed Rate: 14 mm/h (ref 0–20)

## 2023-05-13 LAB — RHEUMATOID FACTOR: Rheumatoid fact SerPl-aCnc: <10 [IU]/mL (ref <14)

## 2023-05-14 NOTE — Progress Notes (Signed)
The tests for rheumatoid arthritis RF and anti-CCP were negative, uric acid normal, sed rate normal.

## 2023-06-04 NOTE — Progress Notes (Unsigned)
Office Visit Note  Patient: Amanda Sanchez             Date of Birth: [birth year]           MRN: [id number]             PCP: Franciso Bend, NP Referring: Shelly Bombard Visit Date: 06/07/2023 Occupation: @GUAROCC @  Subjective:  Right ankle pain  History of Present Illness: Amanda Sanchez is a 42 y.o. 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  She returns today after her last visit on May 11, 2023.  She states she had bilateral knee joint injections at [facility] for osteoarthritis of the knee joints.  Patient states she had cortisone injections and viscosupplement injections which are not lasting long.  She has been told that she has severe osteoarthritis of her right knee joint.  She is requested right partial knee replacement which is scheduled in December 2024.  She continues to have some discomfort in her left knee joint.  She states her lower back continues to hurt.  She has been recently experiencing some discomfort in her right ankle which she describes over the Achilles tendon area in the front of her ankle.  She has not noticed any joint swelling.  None of the other joints are painful.    Activities of Daily Living:  Patient reports morning stiffness for 10 minutes.   Patient Reports nocturnal pain.  Difficulty dressing/grooming: Reports Difficulty climbing stairs: Reports Difficulty getting out of chair: Reports Difficulty using hands for taps, buttons, cutlery, and/or writing: Denies  Review of Systems  Constitutional:  Positive for fatigue.  HENT:  Negative for mouth sores and mouth dryness.   Eyes:  Negative for dryness.  Respiratory:  Negative for shortness of breath.   Cardiovascular:  Negative for chest pain and palpitations.  Gastrointestinal:  Negative for blood in stool, constipation and diarrhea.  Endocrine: Negative for increased urination.  Genitourinary:  Negative for involuntary urination.  Musculoskeletal:   Positive for joint pain, gait problem, joint pain and morning stiffness. Negative for joint swelling, myalgias, muscle weakness, muscle tenderness and myalgias.  Skin:  Positive for color change. Negative for rash, hair loss and sensitivity to sunlight.  Allergic/Immunologic: Negative for susceptible to infections.  Neurological:  Negative for dizziness and headaches.  Hematological:  Negative for swollen glands.  Psychiatric/Behavioral:  Positive for depressed mood and sleep disturbance. The patient is nervous/anxious.     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 09/30/2018   Unwanted fertility 05/25/2018   AMA (advanced maternal age) multigravida 35+ 05/25/2018   Supervision of normal pregnancy 05/02/2018   Rh negative state in antepartum period 06/13/2014   History of cesarean delivery, currently pregnant 05/16/2014   Uterus didelphys    Candidal intertrigo 10/27/2011   Morbid obesity (HCC) 10/20/2011   Hyperlipidemia 10/20/2011    Past Medical History:  Diagnosis Date   Anemia    Anxiety    no meds   Complete bicornuate uterus    2 cervices   GERD (gastroesophageal reflux disease)    Heartburn in pregnancy    zantac prn   Hyperlipidemia    diet controlled, no med   Obesity    Uterus didelphys 2 cervices   Vaginal discharge during pregnancy in first trimester 05/02/2014   Yeast infection 05/02/2014    Family History  Problem Relation Age of Onset  Fibroids Mother    Arthritis Mother    Hyperlipidemia Father    Healthy Brother    Arthritis Maternal Grandmother    Diabetes Maternal Grandfather    Hypertension Maternal Grandfather    Diabetes Paternal Grandmother    Hypertension Paternal Grandmother    Diabetes Paternal Grandfather    Hypertension Paternal Grandfather    Healthy Son    Healthy Son    Healthy Son    Past Surgical History:  Procedure Laterality Date    CESAREAN SECTION  06/2009   [facility]   CESAREAN SECTION N/A 12/21/2014   Procedure: REPEAT CESAREAN SECTION;  Surgeon: Lazaro Arms, MD;  Location: [facility];  Service: Obstetrics;  Laterality: N/A;   CESAREAN SECTION  09/30/2018   [facility]   gastric bypass N/A 02/22/2020   KNEE ARTHROSCOPY WITH MEDIAL MENISECTOMY Right 05/08/2021   Procedure: Right knee arthroscopy,partial medial and lateral meniscectomy;  Surgeon: Kennedy Bucker, MD;  Location: [facility] ORS;  Service: Orthopedics;  Laterality: Right;   WISDOM TOOTH EXTRACTION     Social History   Social History Narrative   Not on file   Immunization History  Administered Date(s) Administered   Rho (D) Immune Globulin 09/15/2018   Tdap 12/22/2014, 09/06/2018, 02/17/2019     Objective: Vital Signs: BP 108/73 (BP Location: Left Arm, Patient Position: Sitting, Cuff Size: Normal)   Pulse 78   Resp 14   Ht 5\' 5"  (1.651 m)   Wt 196 lb (88.9 kg)   BMI 32.62 kg/m    Physical Exam Vitals and nursing note reviewed.  Constitutional:      Appearance: She is well-developed.  HENT:     Head: Normocephalic and atraumatic.  Eyes:     Conjunctiva/sclera: Conjunctivae normal.  Cardiovascular:     Rate and Rhythm: Normal rate and regular rhythm.     Heart sounds: Normal heart sounds.  Pulmonary:     Effort: Pulmonary effort is normal.     Breath sounds: Normal breath sounds.  Abdominal:     General: Bowel sounds are normal.     Palpations: Abdomen is soft.  Musculoskeletal:     Cervical back: Normal range of motion.  Lymphadenopathy:     Cervical: No cervical adenopathy.  Skin:    General: Skin is warm and dry.     Capillary Refill: Capillary refill takes less than 2 seconds.  Neurological:     Mental Status: She is alert and oriented to person, place, and time.  Psychiatric:        Behavior: Behavior normal.     Musculoskeletal Exam: Patient had good range of motion of the cervical thoracic and lumbar spine.  She had discomfort range of  motion of the lumbar spine.  Shoulder joints, elbow joints, wrist joints, MCPs PIPs and DIPs were in good range of motion.  No synovitis was noted.  Hip joints were in good range of motion.  She had discomfort with range of motion of her right knee joint.  No warmth swelling or effusion was noted.  There was no tenderness over ankles or MTPs.  There was no Achilles tendinitis or plantar fasciitis.  She had bilateral pes cavus and hammertoes.  CDAI Exam: CDAI Score: -- Patient Global: --; Provider Global: -- Swollen: --; Tender: -- Joint Exam 06/07/2023   No joint exam has been documented for this visit   There is currently no information documented on the homunculus. Go to the Rheumatology activity and complete the homunculus joint exam.  Investigation:  No additional findings.  Imaging: XR KNEE 3 VIEW LEFT  Result Date: 05/11/2023 Moderate medial compartment narrowing was noted.  Moderate to severe patellofemoral narrowing was noted.  No chondrocalcinosis was noted. 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  XR Lumbar Spine 2-3 Views  Result Date: 05/11/2023 No significant disc space narrowing was noted.  No SI joint narrowing was noted.  Facet joint arthropathy was noted.  L4-L5 spondylolisthesis was noted. Impression: These findings are suggestive of degenerative changes of the lumbar spine.   Recent Labs: Lab Results  Component Value Date   WBC 7.0 02/05/2023   HGB 11.8 (L) 02/05/2023   PLT 307 02/05/2023   NA 135 02/05/2023   K 3.8 02/05/2023   CL 102 02/05/2023   CO2 24 02/05/2023   GLUCOSE 110 (H) 02/05/2023   BUN 11 02/05/2023   CREATININE 0.78 02/05/2023   BILITOT 0.9 02/05/2023   ALKPHOS 64 02/05/2023   AST 14 (L) 02/05/2023   ALT 11 02/05/2023   PROT 7.6 02/05/2023   ALBUMIN 3.9 02/05/2023   CALCIUM 8.8 (L) 02/05/2023   GFRAA >60 04/13/2020   May 11, 2023 sed rate 14, uric acid 3.1,  RF negative, anti-CCP negative   Speciality Comments: No specialty comments available.  Procedures:  No procedures performed Allergies: Patient has no known allergies.   Assessment / Plan:     Visit Diagnoses: Lumbar spondylosis -patient complains of pain and discomfort in the lower back.  She denies any radiculopathy.  X-rays showed facet joint arthropathy and L4-L5 spondylolisthesis.  X-rays obtained at the last visit were reviewed with the patient.  She would benefit from core strengthening exercises.  A handout on exercises was given and she was also referred to physical therapy.- Plan: Ambulatory referral to Physical Therapy  Polyarthralgia -she complains of pain and discomfort in multiple joints.  No synovitis was noted.  All autoimmune workup was negative.  RF negative, anti-CCP negative, sed rate normal, uric acid normal.  Labs were reviewed with the patient.  A list of natural anti-inflammatories was given and their side effects were discussed.  Primary osteoarthritis of both knees -she complains of pain and discomfort in the bilateral knee joints.  She states right knee joint is more painful than the left.  She has discussed her right knee joint pain with her orthopedic surgeon.  She is scheduled for partial knee replacement in December.  She continues to have left knee joint discomfort.  Left knee joint x-rays showed moderate osteoarthritis and severe chondromalacia patella -x-ray findings were reviewed with the patient.  A handout on knee joint exercises was given.  She was also referred to physical therapy.  Plan: Ambulatory referral to Physical Therapy  Pain in right ankle and joints of right foot-she complains of intermittent discomfort in her right ankle.  She describes pain over the Achilles region and over the front of her ankle.  There was no tenderness on palpation.  She had good range of motion of her ankle joint.  No synovitis was noted.  Pes cavus of both feet-she is lateral  pes cavus and hammertoes.  She also puts pressure on the lateral aspect of the foot which could be causing discomfort while she walks.  Patient has a podiatrist.  I advised patient to schedule a follow-up appointment with the podiatrist to discuss arch support.  Other medical problems are listed as follows:  Mixed hyperlipidemia  S/P gastric bypass  History of gastric ulcer  History of gastroesophageal reflux (GERD)  Vitamin D deficiency  Orders: Orders Placed This Encounter  Procedures   Ambulatory referral to Physical Therapy   No orders of the defined types were placed in this encounter.    Follow-Up Instructions: Return in about 6 months (around 12/05/2023) for Osteoarthritis.   Pollyann Savoy, MD  Note - This record has been created using Animal nutritionist.  Chart creation errors have been sought, but may not always  have been located. Such creation errors do not reflect on  the standard of medical care.

## 2023-06-07 ENCOUNTER — Encounter: Payer: Self-pay | Admitting: Rheumatology

## 2023-06-07 ENCOUNTER — Ambulatory Visit: Payer: Medicaid Other | Attending: Rheumatology | Admitting: Rheumatology

## 2023-06-07 VITALS — BP 108/73 | HR 78 | Resp 14 | Ht 65.0 in | Wt 196.0 lb

## 2023-06-07 DIAGNOSIS — E782 Mixed hyperlipidemia: Secondary | ICD-10-CM

## 2023-06-07 DIAGNOSIS — M170 Bilateral primary osteoarthritis of knee: Secondary | ICD-10-CM | POA: Diagnosis not present

## 2023-06-07 DIAGNOSIS — M2550 Pain in unspecified joint: Secondary | ICD-10-CM | POA: Diagnosis not present

## 2023-06-07 DIAGNOSIS — M25571 Pain in right ankle and joints of right foot: Secondary | ICD-10-CM | POA: Diagnosis not present

## 2023-06-07 DIAGNOSIS — Z9884 Bariatric surgery status: Secondary | ICD-10-CM

## 2023-06-07 DIAGNOSIS — M47816 Spondylosis without myelopathy or radiculopathy, lumbar region: Secondary | ICD-10-CM | POA: Diagnosis not present

## 2023-06-07 DIAGNOSIS — Z8719 Personal history of other diseases of the digestive system: Secondary | ICD-10-CM

## 2023-06-07 DIAGNOSIS — Z8711 Personal history of peptic ulcer disease: Secondary | ICD-10-CM

## 2023-06-07 DIAGNOSIS — E559 Vitamin D deficiency, unspecified: Secondary | ICD-10-CM

## 2023-06-07 DIAGNOSIS — Q6671 Congenital pes cavus, right foot: Secondary | ICD-10-CM

## 2023-06-07 DIAGNOSIS — Q6672 Congenital pes cavus, left foot: Secondary | ICD-10-CM

## 2023-06-07 NOTE — Patient Instructions (Signed)
Exercises for Chronic Knee Pain Chronic knee pain is pain that lasts longer than 3 months. For most people with chronic knee pain, exercise and weight loss is an important part of treatment. Your health care provider may want you to focus on: Making the muscles that support your knee stronger. This can take pressure off your knee and reduce pain. Preventing knee stiffness. How far you can move your knee, keeping it there or making it farther. Losing weight (if this applies) to take pressure off your knee, lower your risk for injury, and make it easier for you to exercise. Your provider will help you make an exercise program that fits your needs and physical abilities. Below are simple, low-impact exercises you can do at home. Ask your provider or physical therapist how often you should do your exercise program and how many times to repeat each exercise. General safety tips  Get your provider's approval before doing any exercises. Start slowly and stop any time you feel pain. Do not exercise if your knee pain is flaring up. Warm up first. Stretching a cold muscle can cause an injury. Do 5-10 minutes of easy movement or light stretching before beginning your exercises. Do 5-10 minutes of low-impact activity (like walking or cycling) before starting strengthening exercises. Contact your provider any time you have pain during or after exercising. Exercise can cause discomfort but should not be painful. It is normal to be a little stiff or sore after exercising. Stretching and range-of-motion exercises Front thigh stretch  Stand up straight and support your body by holding on to a chair or resting one hand on a wall. With your legs straight and close together, bend one knee to lift your heel up toward your butt. Using one hand for support, grab your ankle with your free hand. Pull your foot up closer toward your butt to feel the stretch in front of your thigh. Hold the stretch for 30  seconds. Repeat __________ times. Complete this exercise __________ times a day. Back thigh stretch  Sit on the floor with your back straight and your legs out straight in front of you. Place the palms of your hands on the floor and slide them toward your feet as you bend at the hip. Try to touch your nose to your knees and feel the stretch in the back of your thighs. Hold for 30 seconds. Repeat __________ times. Complete this exercise __________ times a day. Calf stretch  Stand facing a wall. Place the palms of your hands flat against the wall, arms extended, and lean slightly against the wall. Get into a lunge position with one leg bent at the knee and the other leg stretched out straight behind you. Keep both feet facing the wall and increase the bend in your knee while keeping the heel of the other leg flat on the ground. You should feel the stretch in your calf. Hold for 30 seconds. Repeat __________ times. Complete this exercise __________ times a day. Strengthening exercises Straight leg lift  Lie on your back with one knee bent and the other leg out straight. Slowly lift the straight leg without bending the knee. Lift until your foot is about 12 inches (30 cm) off the floor. Hold for 3-5 seconds and slowly lower your leg. Repeat __________ times. Complete this exercise __________ times a day. Single leg dip  Stand between two chairs and put both hands on the backs of the chairs for support. Extend one leg out straight with your body  weight resting on the heel of the standing leg. Slowly bend your standing knee to dip your body to the level that is comfortable for you. Hold for 3-5 seconds. Repeat __________ times. Complete this exercise __________ times a day. Hamstring curls  Stand straight, knees close together, facing the back of a chair. Hold on to the back of a chair with both hands. Keep one leg straight. Bend the other knee while bringing the heel up toward the butt  until the knee is bent at a 90-degree angle (right angle). Hold for 3-5 seconds. Repeat __________ times. Complete this exercise __________ times a day. Wall squat  Stand straight with your back, hips, and head against a wall. Step forward one foot at a time with your back still against the wall. Your feet should be 2 feet (61 cm) from the wall at shoulder width. Keeping your back, hips, and head against the wall, slide down the wall to as close to a sitting position as you can get. Hold for 5-10 seconds, then slowly slide back up. Repeat __________ times. Complete this exercise __________ times a day. Step-ups  Stand in front of a sturdy platform or stool that is about 6 inches (15 cm) high. Slowly step up with your left / right foot, keeping your knee in line with your hip and foot. Do not let your knee bend so far that you cannot see your toes. Hold on to a chair for balance, but do not use it for support. Slowly unlock your knee and lower yourself to the starting position. Repeat __________ times. Complete this exercise __________ times a day. Contact a health care provider if: Your exercises cause pain. Your pain is worse after you exercise. Your pain prevents you from doing your exercises. This information is not intended to replace advice given to you by your health care provider. Make sure you discuss any questions you have with your health care provider. Document Revised: 07/28/2022 Document Reviewed: 07/28/2022 Elsevier Patient Education  2024 [employer]   Low Back Sprain or Strain Rehab Ask your health care provider which exercises are safe for you. Do exercises exactly as told by your health care provider and adjust them as directed. It is normal to feel mild stretching, pulling, tightness, or discomfort as you do these exercises. Stop right away if you feel sudden pain or your pain gets worse. Do not begin these exercises until told by your health care provider. Stretching  and range-of-motion exercises These exercises warm up your muscles and joints and improve the movement and flexibility of your back. These exercises also help to relieve pain, numbness, and tingling. Lumbar rotation  Lie on your back on a firm bed or the floor with your knees bent. Straighten your arms out to your sides so each arm forms a 90-degree angle (right angle) with a side of your body. Slowly move (rotate) both of your knees to one side of your body until you feel a stretch in your lower back (lumbar). Try not to let your shoulders lift off the floor. Hold this position for __________ seconds. Tense your abdominal muscles and slowly move your knees back to the starting position. Repeat this exercise on the other side of your body. Repeat __________ times. Complete this exercise __________ times a day. Single knee to chest  Lie on your back on a firm bed or the floor with both legs straight. Bend one of your knees. Use your hands to move your knee up toward  your chest until you feel a gentle stretch in your lower back and buttock. Hold your leg in this position by holding on to the front of your knee. Keep your other leg as straight as possible. Hold this position for __________ seconds. Slowly return to the starting position. Repeat with your other leg. Repeat __________ times. Complete this exercise __________ times a day. Prone extension on elbows  Lie on your abdomen on a firm bed or the floor (prone position). Prop yourself up on your elbows. Use your arms to help lift your chest up until you feel a gentle stretch in your abdomen and your lower back. This will place some of your body weight on your elbows. If this is uncomfortable, try stacking pillows under your chest. Your hips should stay down, against the surface that you are lying on. Keep your hip and back muscles relaxed. Hold this position for __________ seconds. Slowly relax your upper body and return to the  starting position. Repeat __________ times. Complete this exercise __________ times a day. Strengthening exercises These exercises build strength and endurance in your back. Endurance is the ability to use your muscles for a long time, even after they get tired. Pelvic tilt This exercise strengthens the muscles that lie deep in the abdomen. Lie on your back on a firm bed or the floor with your legs extended. Bend your knees so they are pointing toward the ceiling and your feet are flat on the floor. Tighten your lower abdominal muscles to press your lower back against the floor. This motion will tilt your pelvis so your tailbone points up toward the ceiling instead of pointing to your feet or the floor. To help with this exercise, you may place a small towel under your lower back and try to push your back into the towel. Hold this position for __________ seconds. Let your muscles relax completely before you repeat this exercise. Repeat __________ times. Complete this exercise __________ times a day. Alternating arm and leg raises  Get on your hands and knees on a firm surface. If you are on a hard floor, you may want to use padding, such as an exercise mat, to cushion your knees. Line up your arms and legs. Your hands should be directly below your shoulders, and your knees should be directly below your hips. Lift your left leg behind you. At the same time, raise your right arm and straighten it in front of you. Do not lift your leg higher than your hip. Do not lift your arm higher than your shoulder. Keep your abdominal and back muscles tight. Keep your hips facing the ground. Do not arch your back. Keep your balance carefully, and do not hold your breath. Hold this position for __________ seconds. Slowly return to the starting position. Repeat with your right leg and your left arm. Repeat __________ times. Complete this exercise __________ times a day. Abdominal set with straight leg  raise  Lie on your back on a firm bed or the floor. Bend one of your knees and keep your other leg straight. Tense your abdominal muscles and lift your straight leg up, 4-6 inches (10-15 cm) off the ground. Keep your abdominal muscles tight and hold this position for __________ seconds. Do not hold your breath. Do not arch your back. Keep it flat against the ground. Keep your abdominal muscles tense as you slowly lower your leg back to the starting position. Repeat with your other leg. Repeat __________ times. Complete this exercise  __________ times a day. Single leg lower with bent knees Lie on your back on a firm bed or the floor. Tense your abdominal muscles and lift your feet off the floor, one foot at a time, so your knees and hips are bent in 90-degree angles (right angles). Your knees should be over your hips and your lower legs should be parallel to the floor. Keeping your abdominal muscles tense and your knee bent, slowly lower one of your legs so your toe touches the ground. Lift your leg back up to return to the starting position. Do not hold your breath. Do not let your back arch. Keep your back flat against the ground. Repeat with your other leg. Repeat __________ times. Complete this exercise __________ times a day. Posture and body mechanics Good posture and healthy body mechanics can help to relieve stress in your body's tissues and joints. Body mechanics refers to the movements and positions of your body while you do your daily activities. Posture is part of body mechanics. Good posture means: Your spine is in its natural S-curve position (neutral). Your shoulders are pulled back slightly. Your head is not tipped forward (neutral). Follow these guidelines to improve your posture and body mechanics in your everyday activities. Standing  When standing, keep your spine neutral and your feet about hip-width apart. Keep a slight bend in your knees. Your ears, shoulders, and  hips should line up. When you do a task in which you stand in one place for a long time, place one foot up on a stable object that is 2-4 inches (5-10 cm) high, such as a footstool. This helps keep your spine neutral. Sitting  When sitting, keep your spine neutral and keep your feet flat on the floor. Use a footrest, if necessary, and keep your thighs parallel to the floor. Avoid rounding your shoulders, and avoid tilting your head forward. When working at a desk or a computer, keep your desk at a height where your hands are slightly lower than your elbows. Slide your chair under your desk so you are close enough to maintain good posture. When working at a computer, place your monitor at a height where you are looking straight ahead and you do not have to tilt your head forward or downward to look at the screen. Resting When lying down and resting, avoid positions that are most painful for you. If you have pain with activities such as sitting, bending, stooping, or squatting, lie in a position in which your body does not bend very much. For example, avoid curling up on your side with your arms and knees near your chest (fetal position). If you have pain with activities such as standing for a long time or reaching with your arms, lie with your spine in a neutral position and bend your knees slightly. Try the following positions: Lying on your side with a pillow between your knees. Lying on your back with a pillow under your knees. Lifting  When lifting objects, keep your feet at least shoulder-width apart and tighten your abdominal muscles. Bend your knees and hips and keep your spine neutral. It is important to lift using the strength of your legs, not your back. Do not lock your knees straight out. Always ask for help to lift heavy or awkward objects. This information is not intended to replace advice given to you by your health care provider. Make sure you discuss any questions you have with your  health care provider. Document Revised: 11/16/2022  Document Reviewed: 09/30/2020 Elsevier Patient Education  2024 [employer].

## 2023-06-18 ENCOUNTER — Ambulatory Visit: Payer: Medicaid Other | Admitting: Rheumatology

## 2023-11-23 NOTE — Progress Notes (Deleted)
 Office Visit Note  Patient: Amanda Sanchez             Date of Birth: 1981/04/22           MRN: [id number]             PCP: Meri Stammer, NP Referring: Meri Stammer, NP Visit Date: 12/07/2023 Occupation: @GUAROCC @  Subjective:  No chief complaint on file.   History of Present Illness: Amanda Sanchez is a 43 y.o. female ***     Activities of Daily Living:  Patient reports morning stiffness for *** {minute/hour:19697}.   Patient {ACTIONS;DENIES/REPORTS:21021675::"Denies"} nocturnal pain.  Difficulty dressing/grooming: {ACTIONS;DENIES/REPORTS:21021675::"Denies"} Difficulty climbing stairs: {ACTIONS;DENIES/REPORTS:21021675::"Denies"} Difficulty getting out of chair: {ACTIONS;DENIES/REPORTS:21021675::"Denies"} Difficulty using hands for taps, buttons, cutlery, and/or writing: {ACTIONS;DENIES/REPORTS:21021675::"Denies"}  No Rheumatology ROS completed.   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 09/30/2018   Unwanted fertility 05/25/2018   AMA (advanced maternal age) multigravida 35+ 05/25/2018   Supervision of normal pregnancy 05/02/2018   Rh negative state in antepartum period 06/13/2014   History of cesarean delivery, currently pregnant 05/16/2014   Uterus didelphys    Candidal intertrigo 10/27/2011   Morbid obesity (HCC) 10/20/2011   Hyperlipidemia 10/20/2011    Past Medical History:  Diagnosis Date   Anemia    Anxiety    no meds   Complete bicornuate uterus    2 cervices   GERD (gastroesophageal reflux disease)    Heartburn in pregnancy    zantac prn   Hyperlipidemia    diet controlled, no med   Obesity    Uterus didelphys 2 cervices   Vaginal discharge during pregnancy in first trimester 05/02/2014   Yeast infection 05/02/2014    Family History  Problem Relation Age of Onset   Fibroids Mother    Arthritis Mother    Hyperlipidemia Father    Healthy  Brother    Arthritis Maternal Grandmother    Diabetes Maternal Grandfather    Hypertension Maternal Grandfather    Diabetes Paternal Grandmother    Hypertension Paternal Grandmother    Diabetes Paternal Grandfather    Hypertension Paternal Grandfather    Healthy Son    Healthy Son    Healthy Son    Past Surgical History:  Procedure Laterality Date   CESAREAN SECTION  06/2009   [facility]   CESAREAN SECTION N/A 12/21/2014   Procedure: REPEAT CESAREAN SECTION;  Surgeon: Wendelyn Halter, MD;  Location: [facility];  Service: Obstetrics;  Laterality: N/A;   CESAREAN SECTION  09/30/2018   [facility]   gastric bypass N/A 02/22/2020   KNEE ARTHROSCOPY WITH MEDIAL MENISECTOMY Right 05/08/2021   Procedure: Right knee arthroscopy,partial medial and lateral meniscectomy;  Surgeon: Molli Angelucci, MD;  Location: [facility] ORS;  Service: Orthopedics;  Laterality: Right;   WISDOM TOOTH EXTRACTION     Social History   Social History Narrative   Not on file   Immunization History  Administered Date(s) Administered   Rho (D) Immune Globulin  09/15/2018   Tdap 12/22/2014, 09/06/2018, 02/17/2019     Objective: Vital Signs: There were no vitals taken for this visit.   Physical Exam   Musculoskeletal Exam: ***  CDAI Exam: CDAI Score: -- Patient Global: --; Provider Global: -- Swollen: --; Tender: -- Joint Exam 12/07/2023   No joint exam has been documented for this visit   There is currently no information documented on the homunculus. Go to the Rheumatology activity  and complete the homunculus joint exam.  Investigation: No additional findings.  Imaging: No results found.  Recent Labs: Lab Results  Component Value Date   WBC 7.0 02/05/2023   HGB 11.8 (L) 02/05/2023   PLT 307 02/05/2023   NA 135 02/05/2023   K 3.8 02/05/2023   CL 102 02/05/2023   CO2 24 02/05/2023   GLUCOSE 110 (H) 02/05/2023   BUN 11 02/05/2023   CREATININE 0.78 02/05/2023   BILITOT 0.9 02/05/2023   ALKPHOS 64 02/05/2023    AST 14 (L) 02/05/2023   ALT 11 02/05/2023   PROT 7.6 02/05/2023   ALBUMIN 3.9 02/05/2023   CALCIUM 8.8 (L) 02/05/2023   GFRAA >60 04/13/2020    Speciality Comments: No specialty comments available.  Procedures:  No procedures performed Allergies: Patient has no known allergies.   Assessment / Plan:     Visit Diagnoses: No diagnosis found.  Orders: No orders of the defined types were placed in this encounter.  No orders of the defined types were placed in this encounter.   Face-to-face time spent with patient was *** minutes. Greater than 50% of time was spent in counseling and coordination of care.  Follow-Up Instructions: No follow-ups on file.   Dee Farber, CMA  Note - This record has been created using Animal nutritionist.  Chart creation errors have been sought, but may not always  have been located. Such creation errors do not reflect on  the standard of medical care.

## 2023-12-07 ENCOUNTER — Ambulatory Visit: Payer: Medicaid Other | Admitting: Rheumatology

## 2023-12-07 DIAGNOSIS — E559 Vitamin D deficiency, unspecified: Secondary | ICD-10-CM

## 2023-12-07 DIAGNOSIS — M25571 Pain in right ankle and joints of right foot: Secondary | ICD-10-CM

## 2023-12-07 DIAGNOSIS — M170 Bilateral primary osteoarthritis of knee: Secondary | ICD-10-CM

## 2023-12-07 DIAGNOSIS — Z9884 Bariatric surgery status: Secondary | ICD-10-CM

## 2023-12-07 DIAGNOSIS — Q6672 Congenital pes cavus, left foot: Secondary | ICD-10-CM

## 2023-12-07 DIAGNOSIS — Z8719 Personal history of other diseases of the digestive system: Secondary | ICD-10-CM

## 2023-12-07 DIAGNOSIS — M2550 Pain in unspecified joint: Secondary | ICD-10-CM

## 2023-12-07 DIAGNOSIS — Z8711 Personal history of peptic ulcer disease: Secondary | ICD-10-CM

## 2023-12-07 DIAGNOSIS — E782 Mixed hyperlipidemia: Secondary | ICD-10-CM

## 2023-12-07 DIAGNOSIS — M47816 Spondylosis without myelopathy or radiculopathy, lumbar region: Secondary | ICD-10-CM

## 2024-01-31 ENCOUNTER — Other Ambulatory Visit: Payer: Self-pay

## 2024-01-31 ENCOUNTER — Emergency Department (HOSPITAL_COMMUNITY)
Admission: EM | Admit: 2024-01-31 | Discharge: 2024-01-31 | Disposition: A | Discharge status: Home / Self Care | Attending: Emergency Medicine | Admitting: Emergency Medicine

## 2024-01-31 ENCOUNTER — Emergency Department (HOSPITAL_COMMUNITY)

## 2024-01-31 DIAGNOSIS — R600 Localized edema: Secondary | ICD-10-CM | POA: Diagnosis not present

## 2024-01-31 DIAGNOSIS — M25561 Pain in right knee: Secondary | ICD-10-CM | POA: Insufficient documentation

## 2024-01-31 LAB — COMPREHENSIVE METABOLIC PANEL WITH GFR
ALT: 8 U/L (ref 0–44)
AST: 14 U/L — ABNORMAL LOW (ref 15–41)
Albumin: 3.3 g/dL — ABNORMAL LOW (ref 3.5–5.0)
Alkaline Phosphatase: 68 U/L (ref 38–126)
Anion gap: 10 (ref 5–15)
BUN: 9 mg/dL (ref 6–20)
CO2: 23 mmol/L (ref 22–32)
Calcium: 8.6 mg/dL — ABNORMAL LOW (ref 8.9–10.3)
Chloride: 106 mmol/L (ref 98–111)
Creatinine, Ser: 0.77 mg/dL (ref 0.44–1.00)
GFR, Estimated: >60 mL/min (ref >60)
Glucose, Bld: 109 mg/dL — ABNORMAL HIGH (ref 70–99)
Potassium: 4.2 mmol/L (ref 3.5–5.1)
Sodium: 139 mmol/L (ref 135–145)
Total Bilirubin: 0.5 mg/dL (ref 0.0–1.2)
Total Protein: 6.5 g/dL (ref 6.5–8.1)

## 2024-01-31 LAB — CBC WITH DIFFERENTIAL/PLATELET
Abs Immature Granulocytes: 0.01 K/uL (ref 0.00–0.07)
Basophils Absolute: 0 K/uL (ref 0.0–0.1)
Basophils Relative: 1 %
Eosinophils Absolute: 0.2 K/uL (ref 0.0–0.5)
Eosinophils Relative: 4 %
HCT: 33.1 % — ABNORMAL LOW (ref 36.0–46.0)
Hemoglobin: 9.8 g/dL — ABNORMAL LOW (ref 12.0–15.0)
Immature Granulocytes: 0 %
Lymphocytes Relative: 13 %
Lymphs Abs: 0.8 K/uL (ref 0.7–4.0)
MCH: 23 pg — ABNORMAL LOW (ref 26.0–34.0)
MCHC: 29.6 g/dL — ABNORMAL LOW (ref 30.0–36.0)
MCV: 77.5 fL — ABNORMAL LOW (ref 80.0–100.0)
Monocytes Absolute: 0.2 K/uL (ref 0.1–1.0)
Monocytes Relative: 3 %
Neutro Abs: 4.9 K/uL (ref 1.7–7.7)
Neutrophils Relative %: 79 %
Platelets: 301 K/uL (ref 150–400)
RBC: 4.27 MIL/uL (ref 3.87–5.11)
RDW: 15 % (ref 11.5–15.5)
WBC: 6.1 K/uL (ref 4.0–10.5)
nRBC: 0 % (ref 0.0–0.2)

## 2024-01-31 LAB — SEDIMENTATION RATE: Sed Rate: 20 mm/h (ref 0–22)

## 2024-01-31 LAB — C-REACTIVE PROTEIN: CRP: 0.7 mg/dL (ref <1.0)

## 2024-01-31 MED ORDER — DEXAMETHASONE SODIUM PHOSPHATE 10 MG/ML IJ SOLN
10.0000 mg | Freq: Once | INTRAMUSCULAR | Status: AC
  Administered 2024-01-31: 10 mg via INTRAVENOUS
  Filled 2024-01-31: qty 1

## 2024-01-31 MED ORDER — HYDROCODONE-ACETAMINOPHEN 5-325 MG PO TABS: 1.0000 | ORAL_TABLET | Freq: Four times a day (QID) | ORAL | 0 refills | Status: AC | PRN

## 2024-01-31 NOTE — ED Provider Notes (Signed)
 [place] EMERGENCY DEPARTMENT AT [facility] Provider Note   CSN: [id number] Arrival date & time: 01/31/24  9354     Patient presents with: Leg Pain   Amanda Sanchez is a 43 y.o. female.   Patient is a 43 year old female who presents emergency department the chief complaint of bilateral knee pain.  She notes that the pain is worse in her right knee though.  Patient notes that approximate 1 week ago she did get gel injections into bilateral knees.  She notes that since that time she has been experiencing ongoing burning pain to bilateral knees which is worse on the right.  She does note that she gets radiation of the pain down her right leg.  She denies any associated numbness, paresthesias.  She does note that the pain is worse with ambulation and improves with rest.  She denies any associated recent falls or blunt trauma to the affected knees.   Leg Pain      Prior to Admission medications   Medication Sig Start Date End Date Taking? Authorizing Provider  acetaminophen  (TYLENOL ) 500 MG tablet Take 1,000 mg by mouth every 6 (six) hours as needed for moderate pain or headache.    [provider]  Cholecalciferol (VITAMIN D) 50 MCG (2000 UT) CAPS Take 4,000 Units by mouth 2 (two) times a week.    [provider]  diclofenac Sodium (VOLTAREN) 1 % GEL Apply 4 g topically 4 (four) times daily. 04/23/21   [provider]  pantoprazole  (PROTONIX ) 40 MG tablet Take 40 mg by mouth daily. 03/12/21   [provider]  promethazine  (PHENERGAN ) 25 MG suppository Place 1 suppository (25 mg total) rectally every 6 (six) hours as needed for nausea or vomiting. Patient not taking: Reported on 05/11/2023 02/05/23   Edwardo Marsa HERO, PA-C  Sod Fluoride-Potassium Nitrate 1.1-5 % GEL BRUSH FOR 2 MINUTES ONCE DAILY THEN SPIT OUT    [provider]  triamcinolone ointment (KENALOG) 0.5 % Apply 1 application  topically 3 (three) times daily as needed  (eczema).    [provider]    Allergies: Patient has no known allergies.    Review of Systems  Musculoskeletal:        Bilateral knee pain  All other systems reviewed and are negative.   Updated Vital Signs BP 119/73   Pulse 86   Temp 98.4 F (36.9 C) (Oral)   Resp 16   SpO2 99%   Physical Exam Vitals and nursing note reviewed.  Constitutional:      Appearance: Normal appearance.  HENT:     Head: Normocephalic and atraumatic.  Eyes:     Extraocular Movements: Extraocular movements intact.     Conjunctiva/sclera: Conjunctivae normal.     Pupils: Pupils are equal, round, and reactive to light.  Cardiovascular:     Rate and Rhythm: Normal rate and regular rhythm.     Pulses: Normal pulses.     Heart sounds: Normal heart sounds.  Pulmonary:     Effort: Pulmonary effort is normal. No respiratory distress.  Musculoskeletal:        General: Normal range of motion.     Cervical back: Normal range of motion and neck supple.     Comments: Edema noted to bilateral knees, worse to the right, no overlying erythema, warmth, mild tenderness palpation over right knee, nontender palpation of the left knee, nontender palpation of the bilateral hips, ankles or feet, no diffuse edema to the lower extremities, DP  and PT pulses are 2+ distally, no obvious deformity or bruising, no skin breakdown ulceration, no lacerations or abrasions  Skin:    General: Skin is warm and dry.  Neurological:     General: No focal deficit present.     Mental Status: She is alert and oriented to person, place, and time. Mental status is at baseline.  Psychiatric:        Mood and Affect: Mood normal.        Behavior: Behavior normal.        Thought Content: Thought content normal.        Judgment: Judgment normal.     (all labs ordered are listed, but only abnormal results are displayed) Labs Reviewed  CBC WITH DIFFERENTIAL/PLATELET  COMPREHENSIVE METABOLIC PANEL WITH GFR  SEDIMENTATION RATE   C-REACTIVE PROTEIN    EKG: None  Radiology: No results found.   Procedures   Medications Ordered in the ED  dexamethasone  (DECADRON ) injection 10 mg (has no administration in time range)                                    Medical Decision Making Amount and/or Complexity of Data Reviewed Labs: ordered. Radiology: ordered.  Risk Prescription drug management.   This patient presents to the ED for concern of knee pain differential diagnosis includes inflammatory arthritis, septic joint, gout, fracture, contusion, sprain    Additional history obtained:  Additional history obtained from medical records External records from outside source obtained and reviewed including medical records   Lab Tests:  I Ordered, and personally interpreted labs.  The pertinent results include: No leukocytosis, anemia at baseline, normal electrolytes, normal kidney function liver function, normal sed rate   Imaging Studies ordered:  I ordered imaging studies including x-ray of right knee I independently visualized and interpreted imaging which showed degenerative changes I agree with the radiologist interpretation   Medicines ordered and prescription drug management:  I ordered medication including Decadron  for inflammation Reevaluation of the patient after these medicines showed that the patient improved I have reviewed the patients home medicines and have made adjustments as needed   Problem List / ED Course:  Patient symptoms have greatly improved in the emergency department with Decadron .  Suspect the patient is suffering from inflammation secondary to her recent joint injection.  Low suspicion for septic joint at this time.  Physical exam does not suggest this and patient does have full range of motion after Decadron  in the emergency department.  Do not suspect arthrocentesis of warranted this time.  She has no leukocytosis and inflammatory markers are within normal limits.   Close follow-up with primary care doctor was discussed as well as strict return precautions for any new or worsening symptoms.  Patient voiced understanding to the plan and had no additional questions.  Do not Spectamine etiology such as DVT at this point.   Social Determinants of Health:  None        Final diagnoses:  None    ED Discharge Orders     None          Daralene Lonni JONETTA DEVONNA 01/31/24 1227    Towana Ozell BROCKS, MD 01/31/24 [phone]

## 2024-01-31 NOTE — ED Triage Notes (Signed)
 Pt c/o bilateral LE pain, states she feels like her legs are burning, reports she has been getting injections for arthritis and has been having issues since getting them. Pt ambulatory in triage

## 2024-01-31 NOTE — Discharge Instructions (Signed)
 Please follow-up closely with your primary care doctor on an outpatient basis.  Return to emergency department immediately for any new or worsening symptoms.

## 2024-03-16 ENCOUNTER — Emergency Department (HOSPITAL_COMMUNITY): Admission: EM | Admit: 2024-03-16 | Discharge: 2024-03-16 | Discharge status: Against Medical Advice

## 2024-03-16 ENCOUNTER — Emergency Department (HOSPITAL_COMMUNITY)

## 2024-03-16 ENCOUNTER — Emergency Department (HOSPITAL_COMMUNITY)
Admission: EM | Admit: 2024-03-16 | Discharge: 2024-03-16 | Disposition: A | Discharge status: Home / Self Care | Attending: Emergency Medicine | Admitting: Emergency Medicine

## 2024-03-16 ENCOUNTER — Other Ambulatory Visit: Payer: Self-pay

## 2024-03-16 ENCOUNTER — Encounter (HOSPITAL_COMMUNITY): Payer: Self-pay

## 2024-03-16 DIAGNOSIS — N3000 Acute cystitis without hematuria: Secondary | ICD-10-CM | POA: Insufficient documentation

## 2024-03-16 DIAGNOSIS — R1030 Lower abdominal pain, unspecified: Secondary | ICD-10-CM | POA: Diagnosis present

## 2024-03-16 LAB — URINALYSIS, ROUTINE W REFLEX MICROSCOPIC
Bilirubin Urine: NEGATIVE
Glucose, UA: NEGATIVE mg/dL
Ketones, ur: 80 mg/dL — AB
Leukocytes,Ua: NEGATIVE
Nitrite: NEGATIVE
Protein, ur: 30 mg/dL — AB
Specific Gravity, Urine: 1.025 (ref 1.005–1.030)
pH: 5 (ref 5.0–8.0)

## 2024-03-16 LAB — COMPREHENSIVE METABOLIC PANEL WITH GFR
ALT: 11 U/L (ref 0–44)
AST: 15 U/L (ref 15–41)
Albumin: 4.3 g/dL (ref 3.5–5.0)
Alkaline Phosphatase: 72 U/L (ref 38–126)
Anion gap: 10 (ref 5–15)
BUN: 8 mg/dL (ref 6–20)
CO2: 24 mmol/L (ref 22–32)
Calcium: 9.5 mg/dL (ref 8.9–10.3)
Chloride: 104 mmol/L (ref 98–111)
Creatinine, Ser: 0.65 mg/dL (ref 0.44–1.00)
GFR, Estimated: >60 mL/min
Glucose, Bld: 105 mg/dL — ABNORMAL HIGH (ref 70–99)
Potassium: 3.7 mmol/L (ref 3.5–5.1)
Sodium: 138 mmol/L (ref 135–145)
Total Bilirubin: 1.3 mg/dL — ABNORMAL HIGH (ref 0.0–1.2)
Total Protein: 8.1 g/dL (ref 6.5–8.1)

## 2024-03-16 LAB — HCG, QUANTITATIVE, PREGNANCY: hCG, Beta Chain, Quant, S: <1 m[IU]/mL (ref <5)

## 2024-03-16 LAB — CBC
HCT: 36.4 % (ref 36.0–46.0)
Hemoglobin: 11.4 g/dL — ABNORMAL LOW (ref 12.0–15.0)
MCH: 23.7 pg — ABNORMAL LOW (ref 26.0–34.0)
MCHC: 31.3 g/dL (ref 30.0–36.0)
MCV: 75.7 fL — ABNORMAL LOW (ref 80.0–100.0)
Platelets: 406 K/uL — ABNORMAL HIGH (ref 150–400)
RBC: 4.81 MIL/uL (ref 3.87–5.11)
RDW: 16.2 % — ABNORMAL HIGH (ref 11.5–15.5)
WBC: 10.7 K/uL — ABNORMAL HIGH (ref 4.0–10.5)
nRBC: 0 % (ref 0.0–0.2)

## 2024-03-16 LAB — PREGNANCY, URINE: Preg Test, Ur: NEGATIVE

## 2024-03-16 LAB — LIPASE, BLOOD: Lipase: 25 U/L (ref 11–51)

## 2024-03-16 MED ORDER — CEPHALEXIN 500 MG PO CAPS: 500.0000 mg | ORAL_CAPSULE | Freq: Four times a day (QID) | ORAL | 0 refills | Status: AC

## 2024-03-16 MED ORDER — ONDANSETRON HCL 4 MG/2ML IJ SOLN
4.0000 mg | Freq: Once | INTRAMUSCULAR | Status: AC
  Administered 2024-03-16: 4 mg via INTRAVENOUS
  Filled 2024-03-16: qty 2

## 2024-03-16 MED ORDER — SODIUM CHLORIDE 0.9 % IV SOLN
2.0000 g | Freq: Once | INTRAVENOUS | Status: AC
  Administered 2024-03-16: 2 g via INTRAVENOUS
  Filled 2024-03-16: qty 20

## 2024-03-16 MED ORDER — SODIUM CHLORIDE 0.9 % IV BOLUS
1000.0000 mL | Freq: Once | INTRAVENOUS | Status: AC
  Administered 2024-03-16: 1000 mL via INTRAVENOUS

## 2024-03-16 MED ORDER — IOHEXOL 300 MG/ML  SOLN
100.0000 mL | Freq: Once | INTRAMUSCULAR | Status: AC | PRN
  Administered 2024-03-16: 100 mL via INTRAVENOUS

## 2024-03-16 MED ORDER — PANTOPRAZOLE SODIUM 40 MG IV SOLR
40.0000 mg | Freq: Once | INTRAVENOUS | Status: AC
  Administered 2024-03-16: 40 mg via INTRAVENOUS
  Filled 2024-03-16: qty 10

## 2024-03-16 MED ORDER — ONDANSETRON 4 MG PO TBDP: ORAL_TABLET | ORAL | 0 refills | Status: AC

## 2024-03-16 NOTE — ED Triage Notes (Signed)
 Pt reports vomiting since last night.  Pt denies any diarrhea or constipation but is having some global abdominal pain that comes in waves.

## 2024-03-16 NOTE — ED Notes (Signed)
Na x 1 for triage 

## 2024-03-16 NOTE — Discharge Instructions (Signed)
Drink plenty of fluids and follow-up with your family doctor next week. °

## 2024-03-16 NOTE — ED Notes (Signed)
 AVS provided by edp as reviewed with the pt. Pt was able to verbalized understanding with no additional questions at this time. Pt verified pharmacy. Pt going home with family at bedside

## 2024-03-17 NOTE — ED Provider Notes (Signed)
 [place] EMERGENCY DEPARTMENT AT [facility] Provider Note   CSN: [id number] Arrival date & time: 03/16/24  1750     Patient presents with: Vomiting   Amanda Sanchez is a 43 y.o. female.   Patient complains of some lower abdominal pain.  Some nausea no diarrhea no fever no chills  The history is provided by the patient and medical records. No language interpreter was used.  Abdominal Pain Pain location:  Suprapubic Pain quality: aching   Pain radiates to:  Does not radiate Pain severity:  Mild Onset quality:  Sudden Timing:  Constant Progression:  Waxing and waning Chronicity:  New Context: not alcohol use   Relieved by:  Nothing Associated symptoms: no chest pain, no cough, no diarrhea, no fatigue and no hematuria        Prior to Admission medications   Medication Sig Start Date End Date Taking? Authorizing Provider  cephALEXin  (KEFLEX ) 500 MG capsule Take 1 capsule (500 mg total) by mouth 4 (four) times daily. 03/16/24  Yes Nyilah Kight, MD  ondansetron  (ZOFRAN -ODT) 4 MG disintegrating tablet 4mg  ODT q4 hours prn nausea/vomit 03/16/24  Yes Rodger Giangregorio, MD  acetaminophen  (TYLENOL ) 500 MG tablet Take 1,000 mg by mouth every 6 (six) hours as needed for moderate pain or headache.    [provider]  Cholecalciferol (VITAMIN D) 50 MCG (2000 UT) CAPS Take 4,000 Units by mouth 2 (two) times a week.    [provider]  diclofenac Sodium (VOLTAREN) 1 % GEL Apply 4 g topically 4 (four) times daily. 04/23/21   [provider]  HYDROcodone -acetaminophen  (NORCO/VICODIN) 5-325 MG tablet Take 1 tablet by mouth every 6 (six) hours as needed for severe pain (pain score 7-10). 01/31/24   Daralene Lonni BIRCH, PA-C  pantoprazole  (PROTONIX ) 40 MG tablet Take 40 mg by mouth daily. 03/12/21   [provider]  promethazine  (PHENERGAN ) 25 MG suppository Place 1 suppository (25 mg total) rectally every 6 (six) hours as needed for nausea or  vomiting. Patient not taking: Reported on 05/11/2023 02/05/23   Edwardo Marsa HERO, PA-C  Sod Fluoride-Potassium Nitrate 1.1-5 % GEL BRUSH FOR 2 MINUTES ONCE DAILY THEN SPIT OUT    [provider]  triamcinolone ointment (KENALOG) 0.5 % Apply 1 application  topically 3 (three) times daily as needed (eczema).    [provider]    Allergies: Patient has no known allergies.    Review of Systems  Constitutional:  Negative for appetite change and fatigue.  HENT:  Negative for congestion, ear discharge and sinus pressure.   Eyes:  Negative for discharge.  Respiratory:  Negative for cough.   Cardiovascular:  Negative for chest pain.  Gastrointestinal:  Positive for abdominal pain. Negative for diarrhea.  Genitourinary:  Negative for frequency and hematuria.  Musculoskeletal:  Negative for back pain.  Skin:  Negative for rash.  Neurological:  Negative for seizures and headaches.  Psychiatric/Behavioral:  Negative for hallucinations.     Updated Vital Signs BP (!) 163/92   Pulse 69   Temp 98.4 F (36.9 C) (Oral)   Resp 16   Ht 5' 5 (1.651 m)   Wt 90.7 kg   LMP 03/12/2024   SpO2 99%   BMI 33.28 kg/m   Physical Exam Vitals and nursing note reviewed.  Constitutional:      Appearance: She is well-developed.  HENT:     Head: Normocephalic.     Nose: Nose normal.  Eyes:     General: No  scleral icterus.    Conjunctiva/sclera: Conjunctivae normal.  Neck:     Thyroid: No thyromegaly.  Cardiovascular:     Rate and Rhythm: Normal rate and regular rhythm.     Heart sounds: No murmur heard.    No friction rub. No gallop.  Pulmonary:     Breath sounds: No stridor. No wheezing or rales.  Chest:     Chest wall: No tenderness.  Abdominal:     General: There is no distension.     Tenderness: There is abdominal tenderness. There is no rebound.  Musculoskeletal:        General: Normal range of motion.     Cervical back: Neck supple.  Lymphadenopathy:     Cervical:  No cervical adenopathy.  Skin:    Findings: No erythema or rash.  Neurological:     Mental Status: She is alert and oriented to person, place, and time.     Motor: No abnormal muscle tone.     Coordination: Coordination normal.  Psychiatric:        Behavior: Behavior normal.     (all labs ordered are listed, but only abnormal results are displayed) Labs Reviewed  COMPREHENSIVE METABOLIC PANEL WITH GFR - Abnormal; Notable for the following components:      Result Value   Glucose, Bld 105 (*)    Total Bilirubin 1.3 (*)    All other components within normal limits  CBC - Abnormal; Notable for the following components:   WBC 10.7 (*)    Hemoglobin 11.4 (*)    MCV 75.7 (*)    MCH 23.7 (*)    RDW 16.2 (*)    Platelets 406 (*)    All other components within normal limits  URINALYSIS, ROUTINE W REFLEX MICROSCOPIC - Abnormal; Notable for the following components:   APPearance HAZY (*)    Hgb urine dipstick SMALL (*)    Ketones, ur 80 (*)    Protein, ur 30 (*)    Bacteria, UA RARE (*)    All other components within normal limits  URINE CULTURE  LIPASE, BLOOD  PREGNANCY, URINE  HCG, QUANTITATIVE, PREGNANCY    EKG: None  Radiology: CT ABDOMEN PELVIS W CONTRAST Result Date: 03/16/2024 CLINICAL DATA:  Vomiting and abdominal pain EXAM: CT ABDOMEN AND PELVIS WITH CONTRAST TECHNIQUE: Multidetector CT imaging of the abdomen and pelvis was performed using the standard protocol following bolus administration of intravenous contrast. RADIATION DOSE REDUCTION: This exam was performed according to the departmental dose-optimization program which includes automated exposure control, adjustment of the mA and/or kV according to patient size and/or use of iterative reconstruction technique. CONTRAST:  OMNIPAQUE  IOHEXOL  300 MG/ML  SOLN COMPARISON:  01/31/2020 FINDINGS: Lower chest: No acute abnormality. Hepatobiliary: Unremarkable liver. Normal gallbladder. No biliary dilation. Pancreas:  Unremarkable. Spleen: Unremarkable. Adrenals/Urinary Tract: Normal adrenal glands. No urinary calculi or hydronephrosis. Bladder is unremarkable. Stomach/Bowel: Normal caliber large and small bowel. No bowel wall thickening. 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 No enlarged abdominal or pelvic lymph nodes. Reproductive: Uterine didelphys again noted.  Unremarkable ovaries. Other: No free intraperitoneal fluid or air. Musculoskeletal: No acute fracture. IMPRESSION: No acute abnormality of the abdomen or pelvis. Electronically Signed   By: Norman Gatlin M.D.   On: 03/16/2024 22:01     Procedures   Medications Ordered in the ED  sodium chloride  0.9 % bolus 1,000 mL (0 mLs Intravenous Stopped 03/16/24 2225)  ondansetron  (ZOFRAN ) injection 4 mg (4 mg Intravenous Given 03/16/24 2110)  pantoprazole  (PROTONIX ) injection 40 mg (40 mg Intravenous Given 03/16/24 2110)  iohexol  (OMNIPAQUE ) 300 MG/ML solution 100 mL (100 mLs Intravenous Contrast Given 03/16/24 2135)  cefTRIAXone  (ROCEPHIN ) 2 g in sodium chloride  0.9 % 100 mL IVPB (0 g Intravenous Stopped 03/16/24 2333)                                    Medical Decision Making Amount and/or Complexity of Data Reviewed Labs: ordered. Radiology: ordered.  Risk Prescription drug management.   Patient with UTI.  She is sent home on Keflex  and Zofran  and will follow-up with PCP     Final diagnoses:  Acute cystitis without hematuria    ED Discharge Orders          Ordered    ondansetron  (ZOFRAN -ODT) 4 MG disintegrating tablet        03/16/24 2322    cephALEXin  (KEFLEX ) 500 MG capsule  4 times daily        03/16/24 2322               Suzette Pac, MD 03/17/24 1206

## 2024-03-18 LAB — URINE CULTURE

## 2024-03-22 ENCOUNTER — Telehealth: Admitting: Physician Assistant

## 2024-03-22 DIAGNOSIS — T3695XA Adverse effect of unspecified systemic antibiotic, initial encounter: Secondary | ICD-10-CM | POA: Diagnosis not present

## 2024-03-22 DIAGNOSIS — B379 Candidiasis, unspecified: Secondary | ICD-10-CM | POA: Diagnosis not present

## 2024-03-22 MED ORDER — FLUCONAZOLE 150 MG PO TABS: ORAL_TABLET | ORAL | 0 refills | Status: AC

## 2024-03-22 NOTE — Progress Notes (Signed)
 I have spent 5 minutes in review of e-visit questionnaire, review and updating patient chart, medical decision making and response to patient.   Elsie Velma Lunger, PA-C

## 2024-03-22 NOTE — Progress Notes (Signed)

## 2024-05-13 ENCOUNTER — Emergency Department (HOSPITAL_COMMUNITY)

## 2024-05-13 ENCOUNTER — Other Ambulatory Visit: Payer: Self-pay

## 2024-05-13 ENCOUNTER — Encounter (HOSPITAL_COMMUNITY): Payer: Self-pay

## 2024-05-13 ENCOUNTER — Emergency Department (HOSPITAL_COMMUNITY)
Admission: EM | Admit: 2024-05-13 | Discharge: 2024-05-13 | Disposition: A | Discharge status: Home / Self Care | Attending: Emergency Medicine | Admitting: Emergency Medicine

## 2024-05-13 DIAGNOSIS — D649 Anemia, unspecified: Secondary | ICD-10-CM | POA: Diagnosis not present

## 2024-05-13 DIAGNOSIS — R112 Nausea with vomiting, unspecified: Secondary | ICD-10-CM | POA: Insufficient documentation

## 2024-05-13 DIAGNOSIS — R1084 Generalized abdominal pain: Secondary | ICD-10-CM | POA: Insufficient documentation

## 2024-05-13 DIAGNOSIS — D72829 Elevated white blood cell count, unspecified: Secondary | ICD-10-CM | POA: Insufficient documentation

## 2024-05-13 LAB — URINALYSIS, ROUTINE W REFLEX MICROSCOPIC
Bacteria, UA: NONE SEEN
Bilirubin Urine: NEGATIVE
Glucose, UA: NEGATIVE mg/dL
Hgb urine dipstick: NEGATIVE
Ketones, ur: 20 mg/dL — AB
Leukocytes,Ua: NEGATIVE
Nitrite: NEGATIVE
Protein, ur: 30 mg/dL — AB
Specific Gravity, Urine: 1.03 (ref 1.005–1.030)
pH: 5 (ref 5.0–8.0)

## 2024-05-13 LAB — CBC WITH DIFFERENTIAL/PLATELET
Abs Immature Granulocytes: 0.03 K/uL (ref 0.00–0.07)
Basophils Absolute: 0 K/uL (ref 0.0–0.1)
Basophils Relative: 0 %
Eosinophils Absolute: 0 K/uL (ref 0.0–0.5)
Eosinophils Relative: 0 %
HCT: 36.1 % (ref 36.0–46.0)
Hemoglobin: 11.5 g/dL — ABNORMAL LOW (ref 12.0–15.0)
Immature Granulocytes: 0 %
Lymphocytes Relative: 9 %
Lymphs Abs: 1.1 K/uL (ref 0.7–4.0)
MCH: 24.4 pg — ABNORMAL LOW (ref 26.0–34.0)
MCHC: 31.9 g/dL (ref 30.0–36.0)
MCV: 76.5 fL — ABNORMAL LOW (ref 80.0–100.0)
Monocytes Absolute: 0.7 K/uL (ref 0.1–1.0)
Monocytes Relative: 6 %
Neutro Abs: 10.8 K/uL — ABNORMAL HIGH (ref 1.7–7.7)
Neutrophils Relative %: 85 %
Platelets: 335 K/uL (ref 150–400)
RBC: 4.72 MIL/uL (ref 3.87–5.11)
RDW: 15.2 % (ref 11.5–15.5)
WBC: 12.7 K/uL — ABNORMAL HIGH (ref 4.0–10.5)
nRBC: 0 % (ref 0.0–0.2)

## 2024-05-13 LAB — COMPREHENSIVE METABOLIC PANEL WITH GFR
ALT: 8 U/L (ref 0–44)
AST: 14 U/L — ABNORMAL LOW (ref 15–41)
Albumin: 4.2 g/dL (ref 3.5–5.0)
Alkaline Phosphatase: 76 U/L (ref 38–126)
Anion gap: 13 (ref 5–15)
BUN: 8 mg/dL (ref 6–20)
CO2: 24 mmol/L (ref 22–32)
Calcium: 9.3 mg/dL (ref 8.9–10.3)
Chloride: 100 mmol/L (ref 98–111)
Creatinine, Ser: 0.73 mg/dL (ref 0.44–1.00)
GFR, Estimated: >60 mL/min (ref >60)
Glucose, Bld: 101 mg/dL — ABNORMAL HIGH (ref 70–99)
Potassium: 3.9 mmol/L (ref 3.5–5.1)
Sodium: 137 mmol/L (ref 135–145)
Total Bilirubin: 1.2 mg/dL (ref 0.0–1.2)
Total Protein: 7.4 g/dL (ref 6.5–8.1)

## 2024-05-13 LAB — HCG, SERUM, QUALITATIVE: Preg, Serum: NEGATIVE

## 2024-05-13 LAB — LIPASE, BLOOD: Lipase: 14 U/L (ref 11–51)

## 2024-05-13 MED ORDER — METOCLOPRAMIDE HCL 5 MG/ML IJ SOLN
10.0000 mg | Freq: Once | INTRAMUSCULAR | Status: AC
  Administered 2024-05-13: 10 mg via INTRAVENOUS
  Filled 2024-05-13: qty 2

## 2024-05-13 MED ORDER — DIPHENHYDRAMINE HCL 50 MG/ML IJ SOLN
25.0000 mg | Freq: Once | INTRAMUSCULAR | Status: AC
  Administered 2024-05-13: 25 mg via INTRAVENOUS
  Filled 2024-05-13: qty 1

## 2024-05-13 MED ORDER — METOCLOPRAMIDE HCL 10 MG PO TABS: 10.0000 mg | ORAL_TABLET | Freq: Four times a day (QID) | ORAL | 0 refills | Status: AC | PRN

## 2024-05-13 MED ORDER — DICYCLOMINE HCL 20 MG PO TABS: 20.0000 mg | ORAL_TABLET | Freq: Two times a day (BID) | ORAL | 0 refills | Status: AC

## 2024-05-13 MED ORDER — SODIUM CHLORIDE 0.9 % IV BOLUS
1000.0000 mL | Freq: Once | INTRAVENOUS | Status: AC
  Administered 2024-05-13: 1000 mL via INTRAVENOUS

## 2024-05-13 MED ADMIN — Iohexol Inj 300 MG/ML: 100 mL | INTRAVENOUS | NDC 00407141363

## 2024-05-13 NOTE — ED Notes (Signed)
 Pt attempting to provide urine now.

## 2024-05-13 NOTE — ED Provider Notes (Signed)
 [place] EMERGENCY DEPARTMENT AT [facility] Provider Note   CSN: [id number] Arrival date & time: 05/13/24  1415     Patient presents with: Emesis and Nausea   Amanda Sanchez is a 43 y.o. female.   Patient is a 44 year old female who presents to the emergency department with a chief complaint of nausea, vomiting which has been ongoing for approximate the past 2 days.  She does admit to some generalized abdominal discomfort.  She notes that she has symptoms similar to this back in August that did have a UTI at that time.  Patient denies any associated diarrhea or constipation.  She has had no associated fever or chills.  She denies any back or flank pain.   Emesis Associated symptoms: abdominal pain        Prior to Admission medications   Medication Sig Start Date End Date Taking? Authorizing Provider  acetaminophen  (TYLENOL ) 500 MG tablet Take 1,000 mg by mouth every 6 (six) hours as needed for moderate pain or headache.    [provider]  cephALEXin  (KEFLEX ) 500 MG capsule Take 1 capsule (500 mg total) by mouth 4 (four) times daily. 03/16/24   Suzette Pac, MD  Cholecalciferol (VITAMIN D) 50 MCG (2000 UT) CAPS Take 4,000 Units by mouth 2 (two) times a week.    [provider]  diclofenac Sodium (VOLTAREN) 1 % GEL Apply 4 g topically 4 (four) times daily. 04/23/21   [provider]  fluconazole  (DIFLUCAN ) 150 MG tablet Take 1 tablet PO once. Repeat in 3 days if needed. 03/22/24   Gladis Elsie BROCKS, PA-C  HYDROcodone -acetaminophen  (NORCO/VICODIN) 5-325 MG tablet Take 1 tablet by mouth every 6 (six) hours as needed for severe pain (pain score 7-10). 01/31/24   Daralene Lonni BIRCH, PA-C  ondansetron  (ZOFRAN -ODT) 4 MG disintegrating tablet 4mg  ODT q4 hours prn nausea/vomit 03/16/24   Zammit, Joseph, MD  pantoprazole  (PROTONIX ) 40 MG tablet Take 40 mg by mouth daily. 03/12/21   [provider]  promethazine  (PHENERGAN ) 25 MG suppository  Place 1 suppository (25 mg total) rectally every 6 (six) hours as needed for nausea or vomiting. Patient not taking: Reported on 05/11/2023 02/05/23   Edwardo Marsa HERO, PA-C  Sod Fluoride-Potassium Nitrate 1.1-5 % GEL BRUSH FOR 2 MINUTES ONCE DAILY THEN SPIT OUT    [provider]  triamcinolone ointment (KENALOG) 0.5 % Apply 1 application  topically 3 (three) times daily as needed (eczema).    [provider]    Allergies: Patient has no known allergies.    Review of Systems  Gastrointestinal:  Positive for abdominal pain, nausea and vomiting.  All other systems reviewed and are negative.   Updated Vital Signs BP (!) 154/90   Pulse 88   Temp 98 F (36.7 C) (Oral)   Resp 18   Ht 5' 3 (1.6 m)   Wt 84.4 kg   SpO2 100%   BMI 32.95 kg/m   Physical Exam Vitals and nursing note reviewed.  Constitutional:      General: She is not in acute distress.    Appearance: Normal appearance. She is not ill-appearing.  HENT:     Head: Normocephalic and atraumatic.     Nose: Nose normal.     Mouth/Throat:     Mouth: Mucous membranes are moist.  Eyes:     Extraocular Movements: Extraocular movements intact.     Conjunctiva/sclera: Conjunctivae normal.     Pupils: Pupils are equal, round, and reactive to light.  Cardiovascular:     Rate and Rhythm: Normal rate and regular rhythm.     Pulses: Normal pulses.     Heart sounds: Normal heart sounds. No murmur heard.    No gallop.  Pulmonary:     Effort: Pulmonary effort is normal. No respiratory distress.     Breath sounds: Normal breath sounds. No stridor. No wheezing, rhonchi or rales.  Abdominal:     General: Abdomen is flat. Bowel sounds are normal. There is no distension.     Palpations: Abdomen is soft.     Tenderness: There is no abdominal tenderness. There is no guarding.  Musculoskeletal:        General: Normal range of motion.     Cervical back: Normal range of motion and neck supple.  Skin:    General:  Skin is warm and dry.  Neurological:     General: No focal deficit present.     Mental Status: She is alert and oriented to person, place, and time. Mental status is at baseline.  Psychiatric:        Mood and Affect: Mood normal.        Behavior: Behavior normal.        Thought Content: Thought content normal.        Judgment: Judgment normal.     (all labs ordered are listed, but only abnormal results are displayed) Labs Reviewed  COMPREHENSIVE METABOLIC PANEL WITH GFR  LIPASE, BLOOD  CBC WITH DIFFERENTIAL/PLATELET  URINALYSIS, ROUTINE W REFLEX MICROSCOPIC  HCG, SERUM, QUALITATIVE    EKG: None  Radiology: No results found.   Procedures   Medications Ordered in the ED  sodium chloride  0.9 % bolus 1,000 mL (has no administration in time range)  metoCLOPramide  (REGLAN ) injection 10 mg (has no administration in time range)  diphenhydrAMINE  (BENADRYL ) injection 25 mg (has no administration in time range)                                    Medical Decision Making Amount and/or Complexity of Data Reviewed Labs: ordered. Radiology: ordered.  Risk Prescription drug management.   This patient presents to the ED for concern of abdominal pain, nausea and vomiting differential diagnosis includes gastroenteritis, acute appendicitis, cholecystitis, bowel obstruction, diverticulitis, ovarian torsion or cyst, PID, tubo-ovarian abscess, pyelonephritis, kidney stone, pancreatitis, mesenteric ischemia, urinary tract infection    Additional history obtained:  Additional history obtained from medical records External records from outside source obtained and reviewed including medical records   Lab Tests:  I Ordered, and personally interpreted labs.  The pertinent results include: Mild leukocytosis, anemia at baseline, normal kidney function liver function, unremarkable electrolytes, unremarkable urinalysis   Imaging Studies ordered:  I ordered imaging studies including CT  scan abdomen and pelvis I independently visualized and interpreted imaging which showed no acute intra-abdominal surgical process I agree with the radiologist interpretation   Medicines ordered and prescription drug management:  I ordered medication including IV fluids, Reglan , Benadryl  for abdominal pain, nausea, vomiting Reevaluation of the patient after these medicines showed that the patient improved I have reviewed the patients home medicines and have made adjustments as needed   Problem List / ED Course:  Discussed with patient that all workup in the emergency department has been unremarkable.  She does feel much better at this time and is stable for discharge home.  CT scan of abdomen pelvis demonstrated no acute surgical  process.  Suspect an acute viral gastroenteritis at this time.  Will continue symptomatic treatment on outpatient basis.  Urinalysis demonstrated no indication for urinary tract infection.  Plan be for close follow-up with primary care doctor on an outpatient basis was discussed as well as strict turn precautions for any new or worsening symptoms.  Patient voiced understanding to the plan and had no additional questions.   Social Determinants of Health:  None        Final diagnoses:  None    ED Discharge Orders     None          Daralene Lonni JONETTA DEVONNA 05/13/24 2225    Cleotilde Rogue, MD 05/15/24 [phone]

## 2024-05-13 NOTE — ED Triage Notes (Signed)
 Pt from home complains of Nausea and vomiting that started yesterday morning. Pt unable to keep food and meds down. Denies fever Temp of 98 in triage. Pt states that she was seen in August for the same symptoms.

## 2024-05-13 NOTE — Discharge Instructions (Signed)
 Please follow-up closely with your primary care doctor on outpatient basis.  Return to emergency department immediately for any new or worsening symptoms.

## 2024-05-13 NOTE — ED Notes (Addendum)
 Assumed care of pt, found her resting in bed but easily woken.  Pt is not displaying any emesis but reports feeling nauseas.  Pt had no neuro deficits at this time. Visitor bedside as well.
